# Patient Record
Sex: Female | Born: 1955
Health system: Southern US, Community
[De-identification: ages and names within clinical notes are randomized; demographics above are authoritative.]

## PROBLEM LIST (undated history)

## (undated) DIAGNOSIS — Z8709 Personal history of other diseases of the respiratory system: Secondary | ICD-10-CM

## (undated) DIAGNOSIS — Z87440 Personal history of urinary (tract) infections: Secondary | ICD-10-CM

## (undated) DIAGNOSIS — Z8669 Personal history of other diseases of the nervous system and sense organs: Secondary | ICD-10-CM

## (undated) DIAGNOSIS — Z8719 Personal history of other diseases of the digestive system: Secondary | ICD-10-CM

## (undated) DIAGNOSIS — Z8711 Personal history of peptic ulcer disease: Secondary | ICD-10-CM

## (undated) DIAGNOSIS — Z8659 Personal history of other mental and behavioral disorders: Secondary | ICD-10-CM

## (undated) DIAGNOSIS — Z8739 Personal history of other diseases of the musculoskeletal system and connective tissue: Secondary | ICD-10-CM

## (undated) DIAGNOSIS — Z8619 Personal history of other infectious and parasitic diseases: Secondary | ICD-10-CM

## (undated) HISTORY — DX: Personal history of other diseases of the musculoskeletal system and connective tissue: Z87.39

## (undated) HISTORY — DX: Personal history of other diseases of the respiratory system: Z87.09

## (undated) HISTORY — DX: Personal history of other diseases of the digestive system: Z87.19

## (undated) HISTORY — DX: Personal history of urinary (tract) infections: Z87.440

## (undated) HISTORY — DX: Personal history of other mental and behavioral disorders: Z86.59

## (undated) HISTORY — PX: TONSILLECTOMY AND ADENOIDECTOMY: SUR1326

## (undated) HISTORY — DX: Personal history of peptic ulcer disease: Z87.11

## (undated) HISTORY — DX: Personal history of other infectious and parasitic diseases: Z86.19

## (undated) HISTORY — DX: Personal history of other diseases of the nervous system and sense organs: Z86.69

---

## 1998-01-13 HISTORY — PX: PARTIAL HYSTERECTOMY: SHX80

## 2004-09-20 ENCOUNTER — Ambulatory Visit: Payer: Self-pay | Admitting: Family Medicine

## 2004-10-04 ENCOUNTER — Other Ambulatory Visit: Admission: RE | Admit: 2004-10-04 | Discharge: 2004-10-04 | Payer: Self-pay | Admitting: Family Medicine

## 2004-10-04 ENCOUNTER — Ambulatory Visit: Payer: Self-pay | Admitting: Family Medicine

## 2004-11-05 ENCOUNTER — Ambulatory Visit: Payer: Self-pay | Admitting: Family Medicine

## 2004-12-31 ENCOUNTER — Ambulatory Visit: Payer: Self-pay | Admitting: Family Medicine

## 2005-02-18 ENCOUNTER — Ambulatory Visit: Payer: Self-pay | Admitting: Family Medicine

## 2005-05-07 ENCOUNTER — Other Ambulatory Visit: Payer: Self-pay

## 2005-05-07 ENCOUNTER — Emergency Department: Payer: Self-pay | Admitting: Emergency Medicine

## 2006-08-27 ENCOUNTER — Ambulatory Visit: Payer: Self-pay | Admitting: Family Medicine

## 2006-09-17 ENCOUNTER — Ambulatory Visit: Payer: Self-pay | Admitting: Family Medicine

## 2007-08-16 ENCOUNTER — Emergency Department: Payer: Self-pay | Admitting: Emergency Medicine

## 2007-11-08 DIAGNOSIS — K279 Peptic ulcer, site unspecified, unspecified as acute or chronic, without hemorrhage or perforation: Secondary | ICD-10-CM | POA: Insufficient documentation

## 2007-11-08 DIAGNOSIS — K219 Gastro-esophageal reflux disease without esophagitis: Secondary | ICD-10-CM

## 2007-11-08 DIAGNOSIS — N6019 Diffuse cystic mastopathy of unspecified breast: Secondary | ICD-10-CM

## 2007-11-08 DIAGNOSIS — G4700 Insomnia, unspecified: Secondary | ICD-10-CM

## 2007-11-08 DIAGNOSIS — F411 Generalized anxiety disorder: Secondary | ICD-10-CM | POA: Insufficient documentation

## 2007-11-08 DIAGNOSIS — G43909 Migraine, unspecified, not intractable, without status migrainosus: Secondary | ICD-10-CM

## 2007-11-09 ENCOUNTER — Ambulatory Visit: Payer: Self-pay | Admitting: Family Medicine

## 2007-11-09 DIAGNOSIS — IMO0002 Reserved for concepts with insufficient information to code with codable children: Secondary | ICD-10-CM

## 2007-11-22 ENCOUNTER — Ambulatory Visit: Payer: Self-pay | Admitting: Internal Medicine

## 2007-11-22 LAB — CONVERTED CEMR LAB
Bilirubin Urine: NEGATIVE
Nitrite: NEGATIVE
Protein, U semiquant: 100
Urobilinogen, UA: 0.2

## 2008-01-20 ENCOUNTER — Encounter: Payer: Self-pay | Admitting: Family Medicine

## 2008-02-11 ENCOUNTER — Encounter: Payer: Self-pay | Admitting: Family Medicine

## 2008-02-28 ENCOUNTER — Ambulatory Visit: Payer: Self-pay | Admitting: Family Medicine

## 2008-02-28 DIAGNOSIS — R109 Unspecified abdominal pain: Secondary | ICD-10-CM | POA: Insufficient documentation

## 2008-02-28 LAB — CONVERTED CEMR LAB
Glucose, Urine, Semiquant: NEGATIVE
Ketones, urine, test strip: NEGATIVE
Urine crystals, microscopic: 0 /hpf
Urobilinogen, UA: 0.2
WBC Urine, dipstick: NEGATIVE
pH: 6

## 2008-02-29 ENCOUNTER — Encounter: Payer: Self-pay | Admitting: Family Medicine

## 2008-03-03 ENCOUNTER — Encounter: Payer: Self-pay | Admitting: Family Medicine

## 2008-03-03 ENCOUNTER — Ambulatory Visit: Payer: Self-pay | Admitting: Family Medicine

## 2008-04-19 ENCOUNTER — Ambulatory Visit: Payer: Self-pay | Admitting: Family Medicine

## 2008-04-19 DIAGNOSIS — K589 Irritable bowel syndrome without diarrhea: Secondary | ICD-10-CM

## 2008-04-21 LAB — CONVERTED CEMR LAB
ALT: 33 units/L (ref 0–35)
AST: 27 units/L (ref 0–37)
Basophils Absolute: 0 10*3/uL (ref 0.0–0.1)
Basophils Relative: 0.3 % (ref 0.0–3.0)
Eosinophils Absolute: 0.1 10*3/uL (ref 0.0–0.7)
Eosinophils Relative: 1.3 % (ref 0.0–5.0)
H Pylori IgG: NEGATIVE
HCT: 41.6 % (ref 36.0–46.0)
Hemoglobin: 14.4 g/dL (ref 12.0–15.0)
MCHC: 34.7 g/dL (ref 30.0–36.0)
MCV: 86.6 fL (ref 78.0–100.0)
Monocytes Absolute: 0.6 10*3/uL (ref 0.1–1.0)
Neutro Abs: 2.9 10*3/uL (ref 1.4–7.7)
RBC: 4.8 M/uL (ref 3.87–5.11)
Total Bilirubin: 0.7 mg/dL (ref 0.3–1.2)
Total Protein: 6.9 g/dL (ref 6.0–8.3)
WBC: 5.7 10*3/uL (ref 4.5–10.5)

## 2008-05-23 ENCOUNTER — Encounter: Payer: Self-pay | Admitting: Family Medicine

## 2008-05-30 DIAGNOSIS — K828 Other specified diseases of gallbladder: Secondary | ICD-10-CM

## 2008-08-29 ENCOUNTER — Ambulatory Visit: Payer: Self-pay | Admitting: Family Medicine

## 2008-08-29 DIAGNOSIS — R5383 Other fatigue: Secondary | ICD-10-CM

## 2008-08-29 DIAGNOSIS — R5381 Other malaise: Secondary | ICD-10-CM

## 2008-08-31 LAB — CONVERTED CEMR LAB
Albumin: 3.7 g/dL (ref 3.5–5.2)
CO2: 32 meq/L (ref 19–32)
Calcium: 9.1 mg/dL (ref 8.4–10.5)
Creatinine, Ser: 0.6 mg/dL (ref 0.4–1.2)
GFR calc Af Amer: 134 mL/min
GFR calc non Af Amer: 111 mL/min
Glucose, Bld: 82 mg/dL (ref 70–99)
Sodium: 143 meq/L (ref 135–145)

## 2008-09-19 ENCOUNTER — Encounter: Payer: Self-pay | Admitting: Family Medicine

## 2008-09-19 ENCOUNTER — Ambulatory Visit: Payer: Self-pay | Admitting: Family Medicine

## 2008-10-14 ENCOUNTER — Encounter: Payer: Self-pay | Admitting: Family Medicine

## 2008-12-06 ENCOUNTER — Ambulatory Visit: Payer: Self-pay | Admitting: Family Medicine

## 2008-12-07 ENCOUNTER — Telehealth (INDEPENDENT_AMBULATORY_CARE_PROVIDER_SITE_OTHER): Payer: Self-pay | Admitting: Internal Medicine

## 2009-01-12 ENCOUNTER — Telehealth: Payer: Self-pay | Admitting: Family Medicine

## 2009-01-26 ENCOUNTER — Encounter: Payer: Self-pay | Admitting: Family Medicine

## 2009-01-30 ENCOUNTER — Ambulatory Visit: Payer: Self-pay | Admitting: General Surgery

## 2009-05-11 ENCOUNTER — Ambulatory Visit: Payer: Self-pay | Admitting: Family Medicine

## 2009-05-11 DIAGNOSIS — M542 Cervicalgia: Secondary | ICD-10-CM

## 2009-06-01 ENCOUNTER — Telehealth: Payer: Self-pay | Admitting: Family Medicine

## 2010-01-07 ENCOUNTER — Encounter: Payer: Self-pay | Admitting: Family Medicine

## 2010-06-12 ENCOUNTER — Ambulatory Visit: Payer: Self-pay | Admitting: Family Medicine

## 2010-06-13 LAB — CONVERTED CEMR LAB
ALT: 35 units/L (ref 0–35)
AST: 27 units/L (ref 0–37)
Albumin: 3.8 g/dL (ref 3.5–5.2)
Alkaline Phosphatase: 86 units/L (ref 39–117)
Basophils Relative: 0.6 % (ref 0.0–3.0)
Calcium: 8.8 mg/dL (ref 8.4–10.5)
Eosinophils Relative: 1.2 % (ref 0.0–5.0)
GFR calc non Af Amer: 84.99 mL/min (ref >60)
HCT: 42.7 % (ref 36.0–46.0)
HDL: 40.5 mg/dL (ref >39.00)
Hemoglobin: 14.7 g/dL (ref 12.0–15.0)
Lymphocytes Relative: 38.3 % (ref 12.0–46.0)
Lymphs Abs: 2.8 10*3/uL (ref 0.7–4.0)
Monocytes Relative: 8.4 % (ref 3.0–12.0)
Neutro Abs: 3.8 10*3/uL (ref 1.4–7.7)
Potassium: 3.5 meq/L (ref 3.5–5.1)
RBC: 4.87 M/uL (ref 3.87–5.11)
Sodium: 142 meq/L (ref 135–145)
TSH: 1.99 microintl units/mL (ref 0.35–5.50)
Total CHOL/HDL Ratio: 4
Total Protein: 6.6 g/dL (ref 6.0–8.3)
Triglycerides: 261 mg/dL — ABNORMAL HIGH (ref 0.0–149.0)

## 2010-10-08 ENCOUNTER — Telehealth: Payer: Self-pay | Admitting: Family Medicine

## 2010-10-08 DIAGNOSIS — R109 Unspecified abdominal pain: Secondary | ICD-10-CM | POA: Insufficient documentation
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
## 2010-10-15 NOTE — Letter (Signed)
Summary: Patient Cancellation/[facility]  Patient Cancellation/[facility]   Imported By: Lanelle Bal 04/16/2010 10:31:21  _____________________________________________________________________  External Attachment:    Type:   Image     Comment:   External Document

## 2010-10-17 NOTE — Progress Notes (Signed)
Summary: ???hysterectomy  Phone Note Call from Patient Call back at Home Phone [phone] Call back at Work Phone [phone]   Caller: Patient Call For: Judith Part MD Summary of Call: Patient had a partial hysterectomy in 99. Patient says that for about a year now she has been having symptoms as if she were on her period about every 2 weeks. She has lower back pain, cramping, breast are sore. She is asking if this is normal and if not should she set up appt to see youi. Please advise.  Initial call taken by: Melody Comas,  October 08, 2010 1:53 PM  Follow-up for Phone Call        no - that is unusual as at her age she should have gone through menopause  I would recommend f/u with her gyn (looks like Dr Zenaida Niece dalen in past ) to address this  let me know if she wants a ref or to make her own appt  Follow-up by: Judith Part MD,  October 08, 2010 2:27 PM  Additional Follow-up for Phone Call Additional follow up Details #1::        Patient notified as instructed by telephone. Pt said she would like for Dr Milinda Antis to do the referral to Dr Haskel Khan. Pt would like appt if possible 1:00 -1:30pm any day. Pt will wait to hear from pt care coordinator. Pt can be reached during the day at work [phone] and if after 4:30pm please call pt cell [phone].Lewanda Rife LPN  October 08, 2010 2:51 PM   New Problems: PELVIC  PAIN (ICD-789.09)   Additional Follow-up for Phone Call Additional follow up Details #2::    I will do ref for [facility] Follow-up by: Judith Part MD,  October 08, 2010 8:54 PM  New Problems: PELVIC  PAIN (ICD-789.09)

## 2011-09-02 ENCOUNTER — Inpatient Hospital Stay: Payer: Self-pay | Admitting: Unknown Physician Specialty

## 2013-10-21 ENCOUNTER — Ambulatory Visit (INDEPENDENT_AMBULATORY_CARE_PROVIDER_SITE_OTHER): Payer: BC Managed Care – PPO | Admitting: Family Medicine

## 2013-10-21 ENCOUNTER — Encounter: Payer: Self-pay | Admitting: Family Medicine

## 2013-10-21 VITALS — BP 122/80 | HR 83 | Temp 97.8°F | Wt 178.0 lb

## 2013-10-21 DIAGNOSIS — Z1231 Encounter for screening mammogram for malignant neoplasm of breast: Secondary | ICD-10-CM

## 2013-10-21 DIAGNOSIS — IMO0001 Reserved for inherently not codable concepts without codable children: Secondary | ICD-10-CM

## 2013-10-21 DIAGNOSIS — R638 Other symptoms and signs concerning food and fluid intake: Secondary | ICD-10-CM | POA: Insufficient documentation

## 2013-10-21 DIAGNOSIS — M25579 Pain in unspecified ankle and joints of unspecified foot: Secondary | ICD-10-CM

## 2013-10-21 DIAGNOSIS — Z833 Family history of diabetes mellitus: Secondary | ICD-10-CM

## 2013-10-21 DIAGNOSIS — Z0000 Encounter for general adult medical examination without abnormal findings: Secondary | ICD-10-CM | POA: Insufficient documentation

## 2013-10-21 LAB — CBC WITH DIFFERENTIAL/PLATELET
BASOS PCT: 0.4 % (ref 0.0–3.0)
Basophils Absolute: 0 10*3/uL (ref 0.0–0.1)
EOS PCT: 0.9 % (ref 0.0–5.0)
Eosinophils Absolute: 0.1 10*3/uL (ref 0.0–0.7)
HCT: 44.1 % (ref 36.0–46.0)
Hemoglobin: 14.7 g/dL (ref 12.0–15.0)
LYMPHS PCT: 32.5 % (ref 12.0–46.0)
Lymphs Abs: 2.1 10*3/uL (ref 0.7–4.0)
MCHC: 33.3 g/dL (ref 30.0–36.0)
MCV: 88.3 fl (ref 78.0–100.0)
MONOS PCT: 6.7 % (ref 3.0–12.0)
Monocytes Absolute: 0.4 10*3/uL (ref 0.1–1.0)
NEUTROS PCT: 59.5 % (ref 43.0–77.0)
Neutro Abs: 3.9 10*3/uL (ref 1.4–7.7)
Platelets: 205 10*3/uL (ref 150.0–400.0)
RBC: 4.99 Mil/uL (ref 3.87–5.11)
RDW: 13.1 % (ref 11.5–14.6)
WBC: 6.6 10*3/uL (ref 4.5–10.5)

## 2013-10-21 LAB — POCT CBG (FASTING - GLUCOSE)-MANUAL ENTRY: Glucose Fasting, POC: 100 mg/dL — AB (ref 70–99)

## 2013-10-21 LAB — HEMOGLOBIN A1C: Hgb A1c MFr Bld: 5.6 % (ref 4.6–6.5)

## 2013-10-21 LAB — TSH: TSH: 1.83 u[IU]/mL (ref 0.35–5.50)

## 2013-10-21 NOTE — Progress Notes (Signed)
Subjective:    Patient ID: Sarah Case, female    DOB: 09/06/1956, 58 y.o.   MRN: [id number]  HPI Here to re establish   Has had some foot and leg pain and also hands  Also cramps  She takes some otc K - ? How much Also vit C 1000-3000 mg daily   Wondered about DM  Fasting sugar is 100   She has had more thirst and urination  This has been going on for a few years   Wt is fairly stable - needs to loose weight   Walks for exercise - not extra besides - daily activities  Does not really watch her diet - but not a big sweet eater - her downfall is bread and starches   Father had DM - died in his 1240s- IDDM   She will come back for a PE on Tuesday - will get labs today   Patient Active Problem List   Diagnosis Date Noted  . Thirst 10/21/2013  . Pain in joint, ankle and foot 10/21/2013  . Routine general medical examination at a health care facility 10/21/2013  . Other screening mammogram 10/21/2013  . PELVIC  PAIN 10/08/2010  . FOOT PAIN 06/12/2010  . THIRST 06/12/2010  . NECK PAIN 05/11/2009  . FATIGUE 08/29/2008  . OTHER SPECIFIED DISORDER OF GALLBLADDER 05/30/2008  . IRRITABLE BOWEL SYNDROME 04/19/2008  . ABDOMINAL PAIN, RECURRENT 02/28/2008  . DYSPAREUNIA 11/09/2007  . ANXIETY 11/08/2007  . GERD 11/08/2007  . PEPTIC ULCER DISEASE 11/08/2007  . FIBROCYSTIC BREAST DISEASE 11/08/2007  . INSOMNIA 11/08/2007  . MIGRAINES, HX OF 11/08/2007   No past medical history on file. No past surgical history on file. History  Substance Use Topics  . Smoking status: Never Smoker   . Smokeless tobacco: Not on file  . Alcohol Use: No   No family history on file. Not on File No current outpatient prescriptions on file prior to visit.   No current facility-administered medications on file prior to visit.    Review of Systems Review of Systems  Constitutional: Negative for fever, appetite change, fatigue and unexpected weight change.  Eyes: Negative for pain and  visual disturbance.  Respiratory: Negative for cough and shortness of breath.   Cardiovascular: Negative for cp or palpitations    Gastrointestinal: Negative for nausea, diarrhea and constipation.  Genitourinary: Negative for urgency and frequency. pos for excessive thirst for years  Skin: Negative for pallor or rash   Neurological: Negative for weakness, light-headedness, and headaches.  Hematological: Negative for adenopathy. Does not bruise/bleed easily.  Psychiatric/Behavioral: Negative for dysphoric mood. The patient is not nervous/anxious.         Objective:   Physical Exam  Nursing note and vitals reviewed. Constitutional: She appears well-developed and well-nourished. No distress.  obese and well appearing   HENT:  Head: Normocephalic and atraumatic.  Eyes: Conjunctivae and EOM are normal. Pupils are equal, round, and reactive to light.  Neck: Normal range of motion. Neck supple.  Cardiovascular: Normal rate, regular rhythm and intact distal pulses.   Musculoskeletal: She exhibits tenderness. She exhibits no edema.  Some tenderness in ankles and arches of feet  Lymphadenopathy:    She has no cervical adenopathy.  Neurological: She is alert. She has normal reflexes. No cranial nerve deficit. She exhibits normal muscle tone. Coordination normal.  Skin: Skin is warm and dry. No rash noted. No erythema. No pallor.  Psychiatric: She has a normal mood and affect.  Assessment & Plan:

## 2013-10-21 NOTE — Patient Instructions (Addendum)
Try to get 1200-1500 mg of calcium per day with at least 1000 iu of vitamin D - for bone health  We will refer you for mammogram at check out  Labs today I will see you on Tuesday  For leg cramps - try magnesium 250 mg daily over the counter

## 2013-10-21 NOTE — Progress Notes (Signed)
Pre-visit discussion using our clinic review tool. No additional management support is needed unless otherwise documented below in the visit note.  

## 2013-10-23 NOTE — Assessment & Plan Note (Signed)
Lab today for f/u next week CPX

## 2013-10-23 NOTE — Assessment & Plan Note (Signed)
Foot pain is likely msk - disc shoe choices No symptoms or neuropathy-nl exam

## 2013-10-23 NOTE — Assessment & Plan Note (Signed)
Results for orders placed in visit on 10/21/13  POCT CBG (FASTING - GLUCOSE)-MANUAL ENTRY      Result Value Range   Glucose Fasting, POC 100 (*) 70 - 99 mg/dL  [id number] with labs today Thirst has been longstanding Disc need for wt loss to prevent DM

## 2013-10-23 NOTE — Assessment & Plan Note (Signed)
Ref for mammogram annual

## 2013-10-24 LAB — COMPREHENSIVE METABOLIC PANEL
ALBUMIN: 4.2 g/dL (ref 3.5–5.2)
ALT: 52 U/L — ABNORMAL HIGH (ref 0–35)
AST: 44 U/L — ABNORMAL HIGH (ref 0–37)
Alkaline Phosphatase: 74 U/L (ref 39–117)
BUN: 12 mg/dL (ref 6–23)
CALCIUM: 9.1 mg/dL (ref 8.4–10.5)
CHLORIDE: 107 meq/L (ref 96–112)
CO2: 31 meq/L (ref 19–32)
Creatinine, Ser: 0.7 mg/dL (ref 0.4–1.2)
GFR: 97.34 mL/min (ref >60.00)
GLUCOSE: 95 mg/dL (ref 70–99)
POTASSIUM: 3.8 meq/L (ref 3.5–5.1)
Sodium: 143 mEq/L (ref 135–145)
Total Bilirubin: 0.6 mg/dL (ref 0.3–1.2)
Total Protein: 7.1 g/dL (ref 6.0–8.3)

## 2013-10-24 LAB — LIPID PANEL
CHOLESTEROL: 186 mg/dL (ref 0–200)
HDL: 45 mg/dL (ref >39.00)
LDL CALC: 114 mg/dL — AB (ref 0–99)
Total CHOL/HDL Ratio: 4
Triglycerides: 136 mg/dL (ref 0.0–149.0)
VLDL: 27.2 mg/dL (ref 0.0–40.0)

## 2013-10-25 ENCOUNTER — Ambulatory Visit (INDEPENDENT_AMBULATORY_CARE_PROVIDER_SITE_OTHER): Payer: BC Managed Care – PPO | Admitting: Family Medicine

## 2013-10-25 ENCOUNTER — Encounter: Payer: Self-pay | Admitting: Family Medicine

## 2013-10-25 VITALS — BP 110/64 | HR 80 | Temp 98.0°F | Ht 62.5 in | Wt 177.0 lb

## 2013-10-25 DIAGNOSIS — Z1211 Encounter for screening for malignant neoplasm of colon: Secondary | ICD-10-CM | POA: Insufficient documentation

## 2013-10-25 DIAGNOSIS — Z0000 Encounter for general adult medical examination without abnormal findings: Secondary | ICD-10-CM

## 2013-10-25 DIAGNOSIS — K824 Cholesterolosis of gallbladder: Secondary | ICD-10-CM

## 2013-10-25 DIAGNOSIS — R748 Abnormal levels of other serum enzymes: Secondary | ICD-10-CM

## 2013-10-25 DIAGNOSIS — R928 Other abnormal and inconclusive findings on diagnostic imaging of breast: Secondary | ICD-10-CM

## 2013-10-25 LAB — AMYLASE: Amylase: 42 U/L (ref 27–131)

## 2013-10-25 LAB — HEPATIC FUNCTION PANEL
ALK PHOS: 74 U/L (ref 39–117)
ALT: 51 U/L — ABNORMAL HIGH (ref 0–35)
AST: 36 U/L (ref 0–37)
Albumin: 4 g/dL (ref 3.5–5.2)
Bilirubin, Direct: 0.1 mg/dL (ref 0.0–0.3)
TOTAL PROTEIN: 6.9 g/dL (ref 6.0–8.3)
Total Bilirubin: 0.8 mg/dL (ref 0.3–1.2)

## 2013-10-25 LAB — LIPASE: LIPASE: 29 U/L (ref 11.0–59.0)

## 2013-10-25 LAB — FERRITIN: Ferritin: 89.6 ng/mL (ref 10.0–291.0)

## 2013-10-25 NOTE — Patient Instructions (Addendum)
Try to work in exercise - work up to 30 minutes 5 days per week  Stop up front for mammogram referral  Stop up front for colonoscopy referral  For cholesterol- cut the red meat down - Avoid red meat/ fried foods/ egg yolks/ fatty breakfast meats/ butter, cheese and high fat dairy/ and shellfish   More labs for liver function today   When labs return we may want to get an ultrasound of abdomen again

## 2013-10-25 NOTE — Progress Notes (Signed)
Pre-visit discussion using our clinic review tool. No additional management support is needed unless otherwise documented below in the visit note.  

## 2013-10-25 NOTE — Progress Notes (Signed)
Subjective:    Patient ID: Sarah Case, female    DOB: 11-14-1955, 57 y.o.   MRN: [id number]  HPI Pt is here to re-est as a new pt  Was gone since 2011- but then did have a brief visit to screen for DM Has not had other care   Here for health maintenance exam and to review chronic medical problems    Wt is stable overall since 2011  Self care  Eats a fairly healthy diet  Exercise - is fair -active lifestyle but extra exercise (does walk her dog)  Would not consider exercise program yet -does not like exercise    Mood - is overall pretty chipper/ no depression    Has had a partial hysterectomy- that was for bleeding / no cancer  No gyn issues pr problems   Mammogram-needs to re order a dx test  Her last mamm 2011 from [facility] - had abn and pt failed to follow up - she did not mention that to me  Self exam-no lumps (has sore breasts occasionally)  Colon cancer screening -? If she has had one in the past - years ago  Wants ref for this   Declines flu vaccine   In labs - DM screen done- sugar was 100 random and  Lab Results  Component Value Date   HGBA1C 5.6 10/21/2013    No explanation of thirst   Lab Results  Component Value Date   CHOL 186 10/21/2013   CHOL 179 06/12/2010   Lab Results  Component Value Date   HDL 45.00 10/21/2013   HDL 40.50 06/12/2010   Lab Results  Component Value Date   LDLCALC 114* 10/21/2013   Lab Results  Component Value Date   TRIG 136.0 10/21/2013   TRIG 261.0* 06/12/2010   Lab Results  Component Value Date   CHOLHDL 4 10/21/2013   CHOLHDL 4 06/12/2010   Lab Results  Component Value Date   LDLDIRECT 119.3 06/12/2010   overall pretty stable lipids  She does avoid fatty and fried foods  She does eat a lot of red meat    K level is ok = she takes some otc  Lab Results  Component Value Date   K 3.8 10/21/2013      Chemistry      Component Value Date/Time   NA 143 10/21/2013 0845   K 3.8 10/21/2013 0845   CL 107 10/21/2013 0845    CO2 31 10/21/2013 0845   BUN 12 10/21/2013 0845   CREATININE 0.7 10/21/2013 0845      Component Value Date/Time   CALCIUM 9.1 10/21/2013 0845   ALKPHOS 74 10/21/2013 0845   AST 44* 10/21/2013 0845   ALT 52* 10/21/2013 0845   BILITOT 0.6 10/21/2013 0845     2 liver function tests were elevated  No alcohol  No tylenol/ acetaminophen  occ RUQ pain -not regularly - never told she had gallstones / ? In the past - she does not remember ? If poss fatty liver   Lab Results  Component Value Date   WBC 6.6 10/21/2013   HGB 14.7 10/21/2013   HCT 44.1 10/21/2013   MCV 88.3 10/21/2013   PLT 205.0 10/21/2013    Lab Results  Component Value Date   TSH 1.83 10/21/2013     Patient Active Problem List   Diagnosis Date Noted  . Thirst 10/21/2013  . Pain in joint, ankle and foot 10/21/2013  . Routine general medical examination at  a health care facility 10/21/2013  . Other screening mammogram 10/21/2013  . PELVIC  PAIN 10/08/2010  . THIRST 06/12/2010  . OTHER SPECIFIED DISORDER OF GALLBLADDER 05/30/2008  . IRRITABLE BOWEL SYNDROME 04/19/2008  . GERD 11/08/2007  . PEPTIC ULCER DISEASE 11/08/2007  . FIBROCYSTIC BREAST DISEASE 11/08/2007  . INSOMNIA 11/08/2007  . MIGRAINES, HX OF 11/08/2007   Past Medical History  Diagnosis Date  . History of hay fever   . History of stomach ulcers   . History of asthma   . History of arthritis   . History of chicken pox   . History of depression   . History of migraine   . History of UTI   . History of venereal warts     on hands   Past Surgical History  Procedure Laterality Date  . Partial hysterectomy  01/1998   History  Substance Use Topics  . Smoking status: Never Smoker   . Smokeless tobacco: Not on file  . Alcohol Use: No   Family History  Problem Relation Age of Onset  . Arthritis Mother   . Other Mother     Blood clots  . Lung cancer Sister   . Mental illness Mother   . Mental illness Sister   . Diabetes Father    No Known Allergies Current  Outpatient Prescriptions on File Prior to Visit  Medication Sig Dispense Refill  . calcium carbonate 200 MG capsule Take 250 mg by mouth daily.      Marland Kitchen. POTASSIUM PO Take 1 tablet by mouth daily.       No current facility-administered medications on file prior to visit.      Review of Systems Review of Systems  Constitutional: Negative for fever, appetite change, fatigue and unexpected weight change.  Eyes: Negative for pain and visual disturbance.  Respiratory: Negative for cough and shortness of breath.   Cardiovascular: Negative for cp or palpitations    Gastrointestinal: Negative for nausea, diarrhea and constipation. neg for abd pain  Genitourinary: Negative for urgency and frequency.  Skin: Negative for pallor or rash   Neurological: Negative for weakness, light-headedness, numbness and headaches.  Hematological: Negative for adenopathy. Does not bruise/bleed easily.  Psychiatric/Behavioral: Negative for dysphoric mood. The patient is not nervous/anxious.         Objective:   Physical Exam  Constitutional: She appears well-developed and well-nourished. No distress.  obese and well appearing   HENT:  Head: Normocephalic and atraumatic.  Right Ear: External ear normal.  Left Ear: External ear normal.  Mouth/Throat: Oropharynx is clear and moist.  Eyes: Conjunctivae and EOM are normal. Pupils are equal, round, and reactive to light. No scleral icterus.  Neck: Normal range of motion. Neck supple. No JVD present. Carotid bruit is not present. No thyromegaly present.  Cardiovascular: Normal rate, regular rhythm, normal heart sounds and intact distal pulses.  Exam reveals no gallop.   Pulmonary/Chest: Effort normal and breath sounds normal. No respiratory distress. She has no wheezes. She exhibits no tenderness.  Abdominal: Soft. Bowel sounds are normal. She exhibits no distension, no abdominal bruit and no mass. There is no tenderness.  Genitourinary: No breast swelling,  tenderness, discharge or bleeding.  Breast exam: No mass, nodules, thickening, tenderness, bulging, retraction, inflamation, nipple discharge or skin changes noted.  No axillary or clavicular LA.    Musculoskeletal: Normal range of motion. She exhibits no edema and no tenderness.  Lymphadenopathy:    She has no cervical adenopathy.  Neurological: She is  alert. She has normal reflexes. No cranial nerve deficit. She exhibits normal muscle tone. Coordination normal.  Skin: Skin is warm and dry. No rash noted. No erythema. No pallor.  Psychiatric: She has a normal mood and affect.          Assessment & Plan:

## 2013-10-26 ENCOUNTER — Encounter: Payer: Self-pay | Admitting: *Deleted

## 2013-10-26 LAB — HEPATITIS PANEL, ACUTE
HCV Ab: NEGATIVE
Hep A IgM: NONREACTIVE
Hep B C IgM: NONREACTIVE
Hepatitis B Surface Ag: NEGATIVE

## 2013-10-26 NOTE — Assessment & Plan Note (Signed)
Past hx on imaging-then lost to f/u Pt has one elevated LFT  Lab Results  Component Value Date   ALT 51* 10/25/2013   AST 36 10/25/2013   ALKPHOS 74 10/25/2013   BILITOT 0.8 10/25/2013    No other symptoms  Will consider re eval at f/u

## 2013-10-26 NOTE — Assessment & Plan Note (Signed)
Ref for screening colonoscopy 

## 2013-10-26 NOTE — Assessment & Plan Note (Signed)
Lab Results  Component Value Date   ALT 51* 10/25/2013   AST 36 10/25/2013   ALKPHOS 74 10/25/2013   BILITOT 0.8 10/25/2013   Remote hx of gb polyp on US  Will check hepatitis prof and ferritin If all neg consider [place]

## 2013-10-26 NOTE — Assessment & Plan Note (Signed)
Ref for dx mammo  Overdue - pt was prev lost to f/u without insurance and could not afford to go back

## 2013-10-26 NOTE — Assessment & Plan Note (Signed)
Reviewed health habits including diet and exercise and skin cancer prevention Reviewed appropriate screening tests for age  Also reviewed health mt list, fam hx and immunization status , as well as social and family history   Will ref for mammogram and colonoscopy Labs reviewed  Pt was urged to exercise - states candidly that she will not likely do that

## 2013-11-08 ENCOUNTER — Telehealth: Payer: Self-pay | Admitting: Family Medicine

## 2013-11-08 DIAGNOSIS — R928 Other abnormal and inconclusive findings on diagnostic imaging of breast: Secondary | ICD-10-CM

## 2013-11-08 NOTE — Telephone Encounter (Signed)
R breast US order

## 2013-11-08 NOTE — Telephone Encounter (Signed)
Message copied by Judy PimpleWER, MARNE A on Tue Nov 08, 2013  3:32 PM ------      Message from: Carlton AdamKOLOVRAT, MARION      Created: Tue Nov 08, 2013 10:06 AM      Regarding: Right breast US order       Hello Dr Karie Schwalbe, Please put an aorder in for a Right Breast US on this patient. [facility] just called and they need this as well as the [facility]. Also I heard that they have changed the choices for a Breast US so please pick the one that says Right Breast US inc Axilla b/c that is the one you want! Thanks, Shirlee LimerickMarion ------

## 2013-11-09 ENCOUNTER — Ambulatory Visit: Payer: Self-pay | Admitting: Family Medicine

## 2013-11-25 ENCOUNTER — Encounter: Payer: Self-pay | Admitting: Family Medicine

## 2013-12-07 ENCOUNTER — Ambulatory Visit: Payer: Self-pay | Admitting: Gastroenterology

## 2013-12-19 ENCOUNTER — Encounter: Payer: Self-pay | Admitting: Internal Medicine

## 2013-12-19 ENCOUNTER — Ambulatory Visit (INDEPENDENT_AMBULATORY_CARE_PROVIDER_SITE_OTHER): Payer: BC Managed Care – PPO | Admitting: Internal Medicine

## 2013-12-19 VITALS — BP 110/78 | HR 87 | Temp 97.8°F | Wt 178.0 lb

## 2013-12-19 DIAGNOSIS — H1032 Unspecified acute conjunctivitis, left eye: Secondary | ICD-10-CM | POA: Insufficient documentation

## 2013-12-19 DIAGNOSIS — H1030 Unspecified acute conjunctivitis, unspecified eye: Secondary | ICD-10-CM

## 2013-12-19 MED ORDER — SULFACETAMIDE SODIUM 10 % OP SOLN: 2.0000 [drp] | Freq: Four times a day (QID) | OPHTHALMIC | Status: DC

## 2013-12-19 NOTE — Assessment & Plan Note (Signed)
May be allergic but could be bacterial Husband just treated for eye infection Will give sulfa drops Instructions about antihistamines

## 2013-12-19 NOTE — Patient Instructions (Signed)
Please try loratadine 10mg  1-2 daily, cetirizine 10mg  daily or fexofenadine 180 mg daily for your allergy symptoms.

## 2013-12-19 NOTE — Progress Notes (Signed)
Pre visit review using our clinic review tool, if applicable. No additional management support is needed unless otherwise documented below in the visit note. 

## 2013-12-19 NOTE — Progress Notes (Signed)
   Subjective:    Patient ID: Sarah RubinsMarilyn A Schroll, female    DOB: 08/12/1956, 58 y.o.   MRN: [id number]  HPI Here with husband  Husband had infection in eye---seen last week at [facility] urgent care Treated with oral antibiotics  She noticed redness and discharge in left eye today Has had some rhinorrhea yesterday--just clear No cough ?low grade fever--- feels like Doesn't feel sick  Current Outpatient Prescriptions on File Prior to Visit  Medication Sig Dispense Refill  . Ascorbic Acid (VITAMIN C PO) Take 1 tablet by mouth daily.      . calcium carbonate 200 MG capsule Take 250 mg by mouth daily.      Marland Kitchen. MAGNESIUM CITRATE PO Take 1 tablet by mouth daily.      Marland Kitchen. POTASSIUM PO Take 1 tablet by mouth daily.      . Vitamin D, Cholecalciferol, 1000 UNITS TABS Take 1 tablet by mouth daily.       No current facility-administered medications on file prior to visit.    No Known Allergies  Past Medical History  Diagnosis Date  . History of hay fever   . History of stomach ulcers   . History of asthma   . History of arthritis   . History of chicken pox   . History of depression   . History of migraine   . History of UTI   . History of venereal warts     on hands    Past Surgical History  Procedure Laterality Date  . Partial hysterectomy  01/1998    Family History  Problem Relation Age of Onset  . Arthritis Mother   . Other Mother     Blood clots  . Lung cancer Sister   . Mental illness Mother   . Mental illness Sister   . Diabetes Father     History   Social History  . Marital Status: Married    Spouse Name: N/A    Number of Children: N/A  . Years of Education: N/A   Occupational History  . Not on file.   Social History Main Topics  . Smoking status: Never Smoker   . Smokeless tobacco: Never Used  . Alcohol Use: No  . Drug Use: No  . Sexual Activity: Not on file   Other Topics Concern  . Not on file   Social History Narrative  . No narrative on file    Review of Systems Does have some early allergy symptoms---tree sensitive No sore throat     Objective:   Physical Exam  Constitutional: She appears well-developed and well-nourished. No distress.  HENT:  Mouth/Throat: Oropharynx is clear and moist. No oropharyngeal exudate.  Mild nasal congestion   Eyes:  Mild left conjunctival injection  Greenish discharge at inner canthus Right eye quiet           Assessment & Plan:

## 2013-12-27 ENCOUNTER — Encounter: Payer: Self-pay | Admitting: Family Medicine

## 2014-01-04 ENCOUNTER — Telehealth: Payer: Self-pay

## 2014-01-04 NOTE — Telephone Encounter (Signed)
Pt request nurse visit for tb skin test; offered pt appt but not convenient to pts schedule and pt will cb if needed.

## 2014-01-20 ENCOUNTER — Encounter: Payer: Self-pay | Admitting: Family Medicine

## 2014-06-26 ENCOUNTER — Telehealth: Payer: Self-pay | Admitting: Family Medicine

## 2014-06-26 NOTE — Telephone Encounter (Signed)
See prev. note. I can't tell by OV notes (feb 2015) what you recommended her to do for the foot/ankle pain, please advise

## 2014-06-26 NOTE — Telephone Encounter (Signed)
Pt can only come in on Tuesday Dr. Milinda Antisower is full tomorrow so pt scheduled appt for 07/04/14 (next Tuesday), pt declined to see another provider tomorrow. I advise pt if sxs worsen or she develops any new sxs to let us know asap. Pt verbalized understanding

## 2014-06-26 NOTE — Telephone Encounter (Signed)
Please have her come in for a visit - I am interested in poss xrays and some blood work to look for joint disease

## 2014-06-26 NOTE — Telephone Encounter (Signed)
Please ask what her symptoms are like now and what (if anything) is she taking

## 2014-06-26 NOTE — Telephone Encounter (Signed)
Pt said she is having pain in both toes feet ankles knees and legs, and also bad hand pain in both hands. Pt said she also feels very fatigue and "worne out" feeling. Pt has tried Ibuprofen with very little improvement, pt said it's the same pain as when you saw her last time for this

## 2014-06-26 NOTE — Telephone Encounter (Signed)
Pt called stating she is having pain in feet and ankles.  She stated she saw dr tower about this last year and she wanted her medical assistance to call her.  Ms Sarah Case stated she wanted to know what dr tower told her to do last.  I offered to make her an appointment.  She wanted to speak with medical assistance first

## 2014-07-04 ENCOUNTER — Encounter: Payer: Self-pay | Admitting: Family Medicine

## 2014-07-04 ENCOUNTER — Ambulatory Visit (INDEPENDENT_AMBULATORY_CARE_PROVIDER_SITE_OTHER): Payer: BC Managed Care – PPO | Admitting: Family Medicine

## 2014-07-04 VITALS — BP 118/82 | HR 81 | Temp 97.8°F | Ht 62.5 in | Wt 175.8 lb

## 2014-07-04 DIAGNOSIS — R5383 Other fatigue: Secondary | ICD-10-CM | POA: Insufficient documentation

## 2014-07-04 DIAGNOSIS — M2550 Pain in unspecified joint: Secondary | ICD-10-CM | POA: Insufficient documentation

## 2014-07-04 DIAGNOSIS — R5382 Chronic fatigue, unspecified: Secondary | ICD-10-CM

## 2014-07-04 DIAGNOSIS — F341 Dysthymic disorder: Secondary | ICD-10-CM

## 2014-07-04 LAB — CBC WITH DIFFERENTIAL/PLATELET
BASOS ABS: 0 10*3/uL (ref 0.0–0.1)
Basophils Relative: 0.7 % (ref 0.0–3.0)
Eosinophils Absolute: 0.1 10*3/uL (ref 0.0–0.7)
Eosinophils Relative: 1.3 % (ref 0.0–5.0)
HCT: 44.8 % (ref 36.0–46.0)
HEMOGLOBIN: 14.9 g/dL (ref 12.0–15.0)
LYMPHS PCT: 42.2 % (ref 12.0–46.0)
Lymphs Abs: 2.4 10*3/uL (ref 0.7–4.0)
MCHC: 33.4 g/dL (ref 30.0–36.0)
MCV: 87.4 fl (ref 78.0–100.0)
Monocytes Absolute: 0.5 10*3/uL (ref 0.1–1.0)
Monocytes Relative: 8.6 % (ref 3.0–12.0)
NEUTROS ABS: 2.7 10*3/uL (ref 1.4–7.7)
Neutrophils Relative %: 47.2 % (ref 43.0–77.0)
PLATELETS: 196 10*3/uL (ref 150.0–400.0)
RBC: 5.12 Mil/uL — ABNORMAL HIGH (ref 3.87–5.11)
RDW: 12.7 % (ref 11.5–15.5)
WBC: 5.7 10*3/uL (ref 4.0–10.5)

## 2014-07-04 LAB — SEDIMENTATION RATE: Sed Rate: 14 mm/hr (ref 0–22)

## 2014-07-04 LAB — COMPREHENSIVE METABOLIC PANEL
ALK PHOS: 85 U/L (ref 39–117)
ALT: 49 U/L — ABNORMAL HIGH (ref 0–35)
AST: 36 U/L (ref 0–37)
Albumin: 3.5 g/dL (ref 3.5–5.2)
BILIRUBIN TOTAL: 0.7 mg/dL (ref 0.2–1.2)
BUN: 15 mg/dL (ref 6–23)
CO2: 24 mEq/L (ref 19–32)
Calcium: 9.3 mg/dL (ref 8.4–10.5)
Chloride: 106 mEq/L (ref 96–112)
Creatinine, Ser: 0.7 mg/dL (ref 0.4–1.2)
GFR: 89.26 mL/min (ref >60.00)
Glucose, Bld: 106 mg/dL — ABNORMAL HIGH (ref 70–99)
Potassium: 4.2 mEq/L (ref 3.5–5.1)
SODIUM: 143 meq/L (ref 135–145)
TOTAL PROTEIN: 7.1 g/dL (ref 6.0–8.3)

## 2014-07-04 LAB — VITAMIN D 25 HYDROXY (VIT D DEFICIENCY, FRACTURES): VITD: 36.2 ng/mL (ref 30.00–100.00)

## 2014-07-04 LAB — TSH: TSH: 2.7 u[IU]/mL (ref 0.35–4.50)

## 2014-07-04 LAB — VITAMIN B12: Vitamin B-12: 579 pg/mL (ref 211–911)

## 2014-07-04 NOTE — Patient Instructions (Signed)
Labs today for joint pain and fatigue  I think you would benefit from counseling for depression / sad mood /stress- let me know if you want to pursue this  Try to eat a healthy diet and take care of yourself
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
## 2014-07-04 NOTE — Progress Notes (Signed)
Pre visit review using our clinic review tool, if applicable. No additional management support is needed unless otherwise documented below in the visit note. 

## 2014-07-04 NOTE — Assessment & Plan Note (Signed)
Fairly nl exam - but pt does have some tenderness of ankles/ MCP joints and also trigger points in back and neck  PMR and fibromyalgia as well as OA or autoimmune arthritis are in the diff Lab today and update
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
## 2014-07-05 LAB — RHEUMATOID FACTOR

## 2014-07-05 LAB — ANA: ANA: NEGATIVE

## 2014-07-11 ENCOUNTER — Encounter: Payer: Self-pay | Admitting: Family Medicine

## 2014-07-11 ENCOUNTER — Ambulatory Visit (INDEPENDENT_AMBULATORY_CARE_PROVIDER_SITE_OTHER): Payer: BC Managed Care – PPO | Admitting: Family Medicine

## 2014-07-11 ENCOUNTER — Ambulatory Visit (INDEPENDENT_AMBULATORY_CARE_PROVIDER_SITE_OTHER)
Admission: RE | Admit: 2014-07-11 | Discharge: 2014-07-11 | Disposition: A | Discharge status: Home / Self Care | Payer: BC Managed Care – PPO | Source: Ambulatory Visit | Attending: Family Medicine | Admitting: Family Medicine

## 2014-07-11 VITALS — BP 124/78 | HR 81 | Temp 98.0°F | Ht 62.5 in | Wt 175.2 lb

## 2014-07-11 DIAGNOSIS — M25572 Pain in left ankle and joints of left foot: Secondary | ICD-10-CM

## 2014-07-11 DIAGNOSIS — K824 Cholesterolosis of gallbladder: Secondary | ICD-10-CM

## 2014-07-11 DIAGNOSIS — M7918 Myalgia, other site: Secondary | ICD-10-CM | POA: Insufficient documentation

## 2014-07-11 DIAGNOSIS — M791 Myalgia: Secondary | ICD-10-CM

## 2014-07-11 DIAGNOSIS — M2550 Pain in unspecified joint: Secondary | ICD-10-CM

## 2014-07-11 MED ORDER — GABAPENTIN 100 MG PO CAPS: 100.0000 mg | ORAL_CAPSULE | Freq: Two times a day (BID) | ORAL | Status: DC
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
## 2014-07-11 NOTE — Assessment & Plan Note (Signed)
More bloating/nausea and RUQ pain now  Will order abd us to re check this and look for stones or other processes

## 2014-07-11 NOTE — Patient Instructions (Signed)
Foot xray today  Stop at check out to schedule abdominal ultrasound  Try gabapentin 100 mg twice daily- it may make you sleepy at first - it intolerable let me know  Follow up with me in approx 1 month
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
## 2014-07-11 NOTE — Progress Notes (Signed)
Pre visit review using our clinic review tool, if applicable. No additional management support is needed unless otherwise documented below in the visit note. 

## 2014-07-11 NOTE — Assessment & Plan Note (Signed)
Going on for a while xr foot today  No swelling  Tender over 1-3rd metatarsals

## 2014-07-13 ENCOUNTER — Ambulatory Visit (INDEPENDENT_AMBULATORY_CARE_PROVIDER_SITE_OTHER): Payer: BC Managed Care – PPO | Admitting: Family Medicine

## 2014-07-13 ENCOUNTER — Encounter: Payer: Self-pay | Admitting: Family Medicine

## 2014-07-13 VITALS — BP 110/80 | HR 94 | Temp 98.0°F | Ht 62.5 in | Wt 176.5 lb

## 2014-07-13 DIAGNOSIS — M7918 Myalgia, other site: Secondary | ICD-10-CM

## 2014-07-13 DIAGNOSIS — Q666 Other congenital valgus deformities of feet: Secondary | ICD-10-CM

## 2014-07-13 DIAGNOSIS — M7061 Trochanteric bursitis, right hip: Secondary | ICD-10-CM

## 2014-07-13 DIAGNOSIS — M76829 Posterior tibial tendinitis, unspecified leg: Secondary | ICD-10-CM

## 2014-07-13 DIAGNOSIS — M7062 Trochanteric bursitis, left hip: Secondary | ICD-10-CM

## 2014-07-13 DIAGNOSIS — M791 Myalgia: Secondary | ICD-10-CM

## 2014-07-13 NOTE — Progress Notes (Signed)
Pre visit review using our clinic review tool, if applicable. No additional management support is needed unless otherwise documented below in the visit note. 
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
## 2014-07-13 NOTE — Patient Instructions (Signed)
Posterior Tib and arch rehab Begin with easy walking, heel, toe and backwards * Try to pick an easy location like a hallway or a room in your house and do one of these each time that you go through this area.  Only do the next one if it does not hurt Calf raises on a step - Pidgeon Toes, with toes turned inward Try to do most days of the week If pain persists at 3 sets of 30 - add backpack with 5 lbs Increase by 5 lbs per week to max of 30 lbs  Towel "Scrunch Ups" Use a hand towel or a moderate size towel Foot flat down on the towel Use toes to "scrunch up the towel" straight up and down, and going to the right and left.  3 sets of 20 * Can be done watching TV, reading, or sitting and relaxing.    SHOES: Danskos, Merrells, Keens, Hermanlarks, Birkenstocks - good arch support, want minimal bendability. Finn Comfort also excellent, but even more expensive.  Tennis Shoes / Running Shoes: Many good companies and styles. The most important thing is to get a good fit and wear a shoe that feels good in the store. Walk around in the store. Run if that is something that you can do. Some of the running stores have a treadmill, also.  Brooks, New Balance, Andrena MewsSaucony are good, but the most important thing is to wear a shoe that fits your foot and feels good.  FLEET FEET, [place]

## 2014-07-14 ENCOUNTER — Telehealth: Payer: Self-pay | Admitting: Family Medicine

## 2014-07-14 NOTE — Telephone Encounter (Signed)
Pt called: still having a lot of pain from feet to hips. Pt would like you recommendation on what type of shoe should she purchase since she is having a lot of pain?

## 2014-07-15 NOTE — Telephone Encounter (Signed)
I saw her yesterday. We had a lengthy discussion about this very question. Hopefully, her problems will get somewhat better on the order of weeks to months. It is not realistic to think you will be better in a few days.  Please resend her AVS, which details all of those specific questions to her.

## 2014-07-18 NOTE — Telephone Encounter (Signed)
This is not a yes / no answer.   My AVS lists a number of brands that are quite good. She needs to go by fit and feel. It would be hard to go wrong with Wells Fargoew Balance, TowBrooks, or [place]aucony.

## 2014-07-18 NOTE — Telephone Encounter (Signed)
I think all she is wanting is your recommendation on what type of shoe she should purchase??

## 2014-07-19 NOTE — Telephone Encounter (Signed)
Yes, I looked at her x-rays. They are totally fine. No arthritis.  I don't know anything about the store in [place].   I want her to get comfortable shoes -- her feet need to guide what feels the best to her.   New Balance, Merrells, SipseyBrooks, or Happy ValleySaucony are reasonable, but personal preference needs to determine what she gets.   The 2 stores that I think have the most selection and best salesman are [facility] [employer] on [address] And Off and Running on [address], both in [place].

## 2014-07-19 NOTE — Telephone Encounter (Signed)
Spoke with Sarah Case.  She states she went to [employer] and tried on many brands but was not able to find any shoe that didn't rub against her left ankle.   She was recommended another store in [place] called [facility] [employer] and wants to know how Dr. Patsy Lageropland feels about that store.  She would also like to  know if he is wanting her to get support plus cushion as far as shoes goes.  She is also inquiring whether Dr. Patsy Lageropland has looked at the x-ray that Dr. Milinda Antisower got of her Left Foot.  Please Advise.

## 2014-07-19 NOTE — Telephone Encounter (Signed)
Left message for Sarah Case to return my call. 

## 2014-07-19 NOTE — Telephone Encounter (Signed)
Left message for Ms. Fraizer to return my call.

## 2014-07-20 NOTE — Telephone Encounter (Signed)
Ms. Sarah Case notified as instructed by Dr. Patsy Lageropland.

## 2014-07-24 ENCOUNTER — Telehealth: Payer: Self-pay

## 2014-07-24 MED ORDER — GABAPENTIN 300 MG PO CAPS: 300.0000 mg | ORAL_CAPSULE | Freq: Three times a day (TID) | ORAL | Status: DC

## 2014-07-24 NOTE — Telephone Encounter (Signed)
I think she is taking 200 mg bid  I would inc that to 300 mg bid and after a week to tid (am/ midday and bedtime) as tolerated  Please call that in   Let me know how it goes in the next several weeks

## 2014-07-24 NOTE — Telephone Encounter (Signed)
Pt left v/m; pt doubled gabapentin and pt continues with pain without effectiveness. Pt wants to know if gabapentin can be increased more; pt is in pain all the time. Pt request cb.

## 2014-07-24 NOTE — Telephone Encounter (Signed)
Left voicemail letting pt know Dr. Royden Purlower's comments/recommendations  Rx sent to pharmacy

## 2014-07-25 ENCOUNTER — Ambulatory Visit: Payer: Self-pay | Admitting: Family Medicine

## 2014-07-25 ENCOUNTER — Encounter: Payer: Self-pay | Admitting: Family Medicine

## 2014-08-22 ENCOUNTER — Ambulatory Visit (INDEPENDENT_AMBULATORY_CARE_PROVIDER_SITE_OTHER): Payer: BC Managed Care – PPO | Admitting: Family Medicine

## 2014-08-22 ENCOUNTER — Encounter: Payer: Self-pay | Admitting: Family Medicine

## 2014-08-22 VITALS — BP 122/86 | HR 83 | Temp 98.1°F | Ht 62.5 in | Wt 176.8 lb

## 2014-08-22 DIAGNOSIS — M7918 Myalgia, other site: Secondary | ICD-10-CM

## 2014-08-22 DIAGNOSIS — R3911 Hesitancy of micturition: Secondary | ICD-10-CM

## 2014-08-22 DIAGNOSIS — F4323 Adjustment disorder with mixed anxiety and depressed mood: Secondary | ICD-10-CM

## 2014-08-22 DIAGNOSIS — M791 Myalgia: Secondary | ICD-10-CM

## 2014-08-22 DIAGNOSIS — R312 Other microscopic hematuria: Secondary | ICD-10-CM

## 2014-08-22 DIAGNOSIS — R3129 Other microscopic hematuria: Secondary | ICD-10-CM

## 2014-08-22 DIAGNOSIS — K824 Cholesterolosis of gallbladder: Secondary | ICD-10-CM

## 2014-08-22 DIAGNOSIS — R339 Retention of urine, unspecified: Secondary | ICD-10-CM

## 2014-08-22 LAB — POCT URINALYSIS DIPSTICK
Bilirubin, UA: NEGATIVE
Glucose, UA: NEGATIVE
LEUKOCYTES UA: NEGATIVE
NITRITE UA: NEGATIVE
PH UA: 6
PROTEIN UA: NEGATIVE
Spec Grav, UA: 1.025
Urobilinogen, UA: 0.2

## 2014-08-22 MED ORDER — GABAPENTIN 300 MG PO CAPS: 300.0000 mg | ORAL_CAPSULE | Freq: Three times a day (TID) | ORAL | Status: DC

## 2014-08-22 NOTE — Patient Instructions (Addendum)
Stop up front for referral to surgeon  Drink lots of water = I am sending your urine for a culture  If urine symptoms worsen please let me know Continue the gabapentin -I'm glad it is helping  Follow up with me in about 3 months Work on healthy diet and exercise for weight loss
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
## 2014-08-22 NOTE — Progress Notes (Signed)
Pre visit review using our clinic review tool, if applicable. No additional management support is needed unless otherwise documented below in the visit note. 

## 2014-08-23 DIAGNOSIS — F4323 Adjustment disorder with mixed anxiety and depressed mood: Secondary | ICD-10-CM | POA: Insufficient documentation

## 2014-08-23 LAB — URINE CULTURE
Colony Count: NO GROWTH
Organism ID, Bacteria: NO GROWTH
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
## 2014-08-23 NOTE — Assessment & Plan Note (Signed)
Unsure of cause  Did not start at same time as gabapentin  No gyn c/o  Urine sent for cx

## 2014-08-23 NOTE — Assessment & Plan Note (Signed)
ua had some micro hematuria  Sent for cx  Pt denies vaginal bleeding

## 2014-08-23 NOTE — Assessment & Plan Note (Signed)
Pt is symptomatic with RUQ pain intermittently  Rev her US - has stable gb polyps and fatty liver  She is open to surgical referral after the holidays  Ref done  If worse in the meantime will alert us

## 2014-08-23 NOTE — Assessment & Plan Note (Signed)
Improved on gabapentin  Plans to continue this and tolerating well  Some fatigue-was there before Disc benefits of low impact exercise

## 2014-08-30 ENCOUNTER — Telehealth: Payer: Self-pay

## 2014-08-30 NOTE — Telephone Encounter (Signed)
Please make appt for pt's cough, congestion, ears hurt, and sore throat with 1 avail provider

## 2014-08-30 NOTE — Telephone Encounter (Signed)
Please make appt for first avail

## 2014-08-30 NOTE — Telephone Encounter (Signed)
I spoke to patient and she's being seen at [facility].

## 2014-08-30 NOTE — Telephone Encounter (Signed)
PLEASE NOTE: All timestamps contained within this report are represented as [place] Standard Time. CONFIDENTIALTY NOTICE: This fax transmission is intended only for the addressee. It contains information that is legally privileged, confidential or otherwise protected from use or disclosure. If you are not the intended recipient, you are strictly prohibited from reviewing, disclosing, copying using or disseminating any of this information or taking any action in reliance on or regarding this information. If you have received this fax in error, please notify us immediately by telephone so that we can arrange for its return to us. Phone: [phone], Toll-Free: [phone], Fax: [phone] Page: 1 of 2 Call Id: [id number] [facility] - Client TELEPHONE ADVICE RECORD [facility] Patient Name: Sarah GrimeMARILYN Case Gender: Female DOB: 05/19/1956 Age: [age] Y 1 M 14 D Return Phone Number: [phone] (Primary) Address: City/State/Zip: [place] [facility] [place] [facility] Day Physician [place], [place] Contact Type Call Call Type Triage / Clinical Relationship To Patient Self Return Phone Number [phone] (Primary) Chief Complaint Cough Initial Comment Caller states has cough, congestion, ears hurt, sore throat PreDisposition Did not know what to do Nurse Assessment Nurse: Chrys RacerKoenig, RN, Alexia FreestoneAnna Marie Date/Time Lamount Cohen(Eastern Time): 08/30/2014 11:54:24 AM Confirm and document reason for call. If symptomatic, describe symptoms. ---Caller states has cough, congestion, ears hurt, sore throat Afebrile. No N/V/D Has the patient traveled out of the country within the last 30 days? ---No Does the patient require triage? ---Yes Related visit to physician within the last 2 weeks? ---Yes Does the PT have any chronic conditions? (i.e. diabetes, asthma, etc.) ---No Guidelines Guideline Title Affirmed  Question Affirmed Notes Nurse Date/Time Lamount Cohen(Eastern Time) Cough - Acute Non- Productive Earache is present Chrys RacerKoenig, RN, Alexia Freestonenna Marie 08/30/2014 11:56:15 AM Disp. Time Lamount Cohen(Eastern Time) Disposition Final User 08/30/2014 11:14:53 AM Attempt made - message left Chrys RacerKoenig, RN, Alexia Freestonenna Marie 08/30/2014 11:27:36 AM Attempt made - message left Chrys RacerKoenig, RN, Alexia Freestonenna Marie 08/30/2014 12:00:55 PM See Physician within 24 Hours Yes Chrys RacerKoenig, RN, Alexia FreestoneAnna Marie Caller Understands: Yes Disagree/Comply: Comply Care Advice Given Per Guideline PLEASE NOTE: All timestamps contained within this report are represented as [place] Standard Time. CONFIDENTIALTY NOTICE: This fax transmission is intended only for the addressee. It contains information that is legally privileged, confidential or otherwise protected from use or disclosure. If you are not the intended recipient, you are strictly prohibited from reviewing, disclosing, copying using or disseminating any of this information or taking any action in reliance on or regarding this information. If you have received this fax in error, please notify us immediately by telephone so that we can arrange for its return to us. Phone: [phone], Toll-Free: [phone], Fax: [phone] Page: 2 of 2 Call Id: [id number] Care Advice Given Per Guideline SEE PHYSICIAN WITHIN 24 HOURS: * IF OFFICE WILL BE OPEN: You need to be examined within the next 24 hours. Call your doctor when the office opens, and make an appointment. COUGH DROPS FOR COUGH: * Cough drops can help a lot, especially for mild coughs. They reduce coughing by soothing your irritated throat and removing that tickle sensation in the back of the throat. * Cough drops are available over-the-counter (OTC). HOME REMEDY - HARD CANDY: Hard candy works just as well as a medicine-flavored OTC cough drops. COUGHING SPELLS: * Drink warm fluids. Inhale warm mist. (Reason: both relax the airway and loosen up the phlegm) * Suck on cough drops  or hard candy to  coat the irritated throat. AVOID TOBACCO SMOKE: Smoking or being exposed to smoke makes coughs much worse. PAIN MEDICINES: * For pain relief, take acetaminophen, ibuprofen, or naproxen. ACETAMINOPHEN (E.G., TYLENOL): IBUPROFEN (E.G., MOTRIN, ADVIL): * Take 650 mg (two 325 mg pills) by mouth every 4-6 hours as needed. Each Regular Strength Tylenol pill has 325 mg of acetaminophen. The most you should take each day is 3,250 mg (10 Regular Strength pills a day). * Take 400 mg (two 200 mg pills) by mouth every 6 hours as needed. CAUTION - NSAIDS (E.G., IBUPROFEN, NAPROXEN): * GASTROINTESTINAL RISK: There is an increased risk of stomach ulcers, GI bleeding, perforation. CALL BACK IF: * You become worse. CARE ADVICE given per Cough - Acute Non- Productive (Adult) guideline. After Care Instructions Given Call Event Type User Date / Time Description Comments User: Kandace Blitzebra, Spencer Date/Time Lamount Cohen(Eastern Time): 08/30/2014 11:47:19 AM Patient has called back in and I have sparked the nurse. No response so I told pt nurse would try her again. Referrals REFERRED TO PCP OFFICE

## 2014-11-28 ENCOUNTER — Ambulatory Visit: Payer: BC Managed Care – PPO | Admitting: Family Medicine

## 2015-02-09 IMAGING — US ABDOMEN ULTRASOUND
1 series · 14 of 25 positions shown · non-contrast
Comparison: 01/30/2009

CLINICAL DATA: Right-sided abdominal pain

EXAM:
ULTRASOUND ABDOMEN COMPLETE

[Series 1: abdomen ultrasound · 0.23mm/px · 14 of 122 slices shown]
[im 1/122]
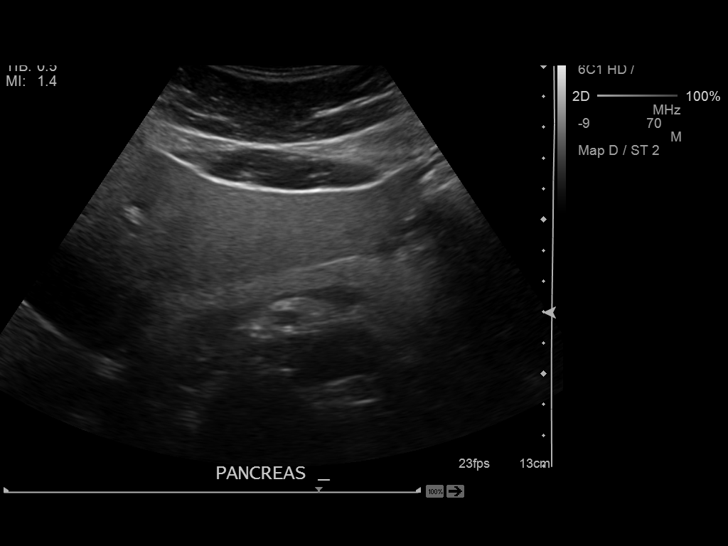
[im 11/122]
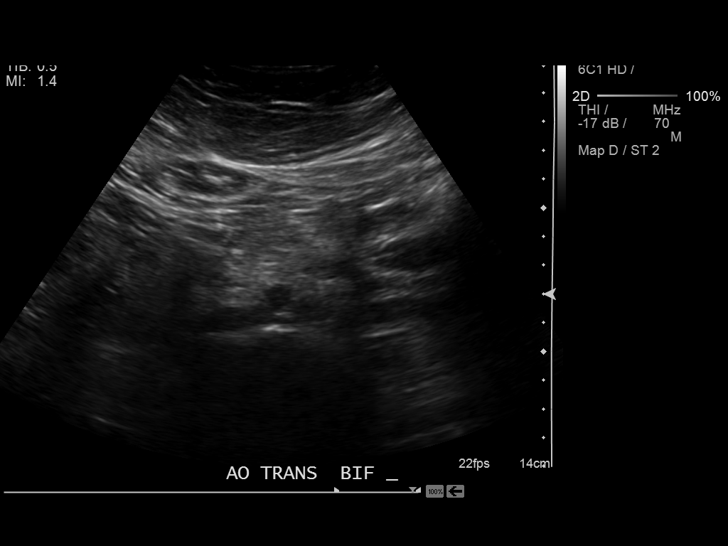
[im 21/122]
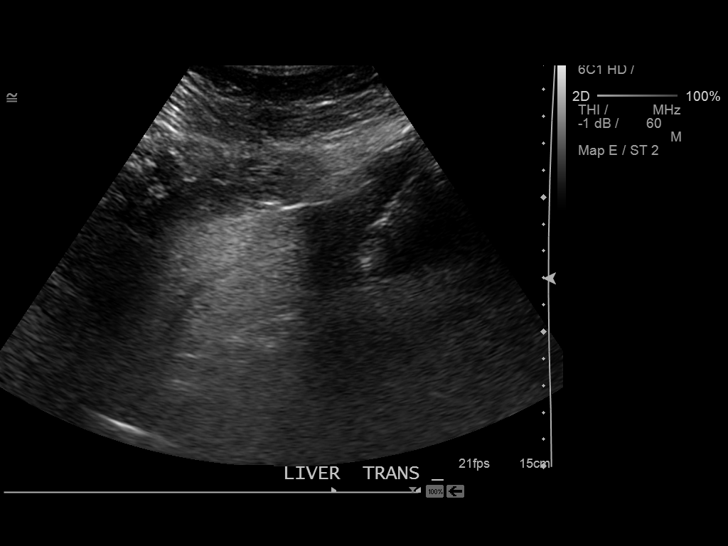
[im 31/122]
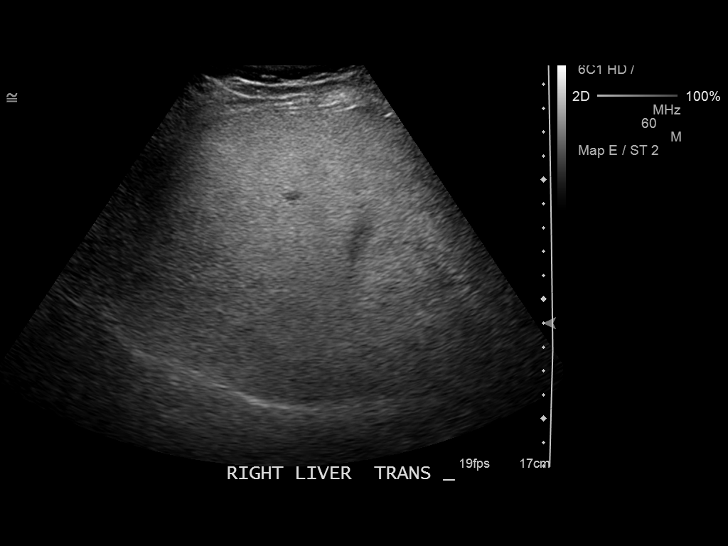
[im 41/122]
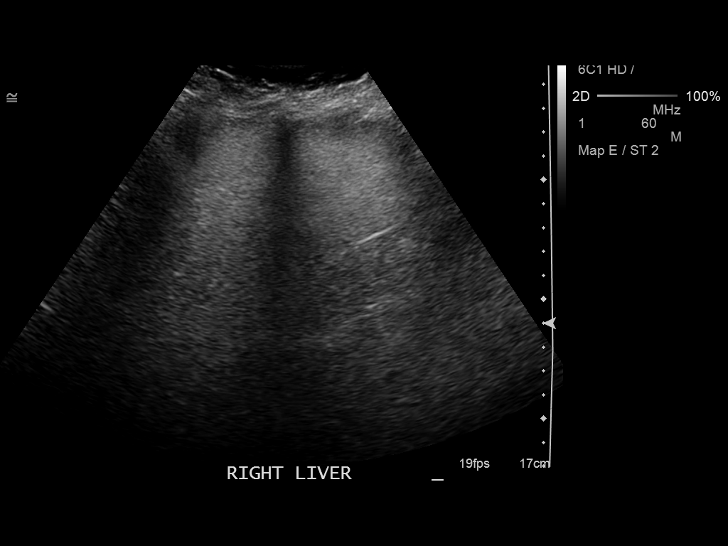
[im 46/122]
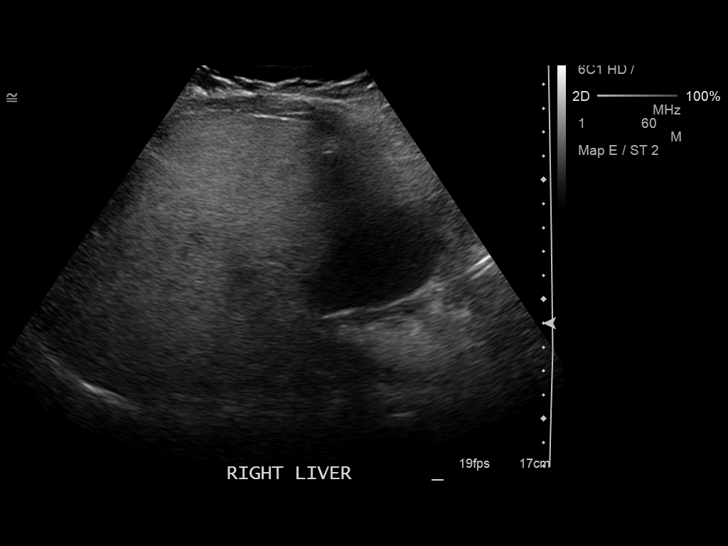
[im 56/122]
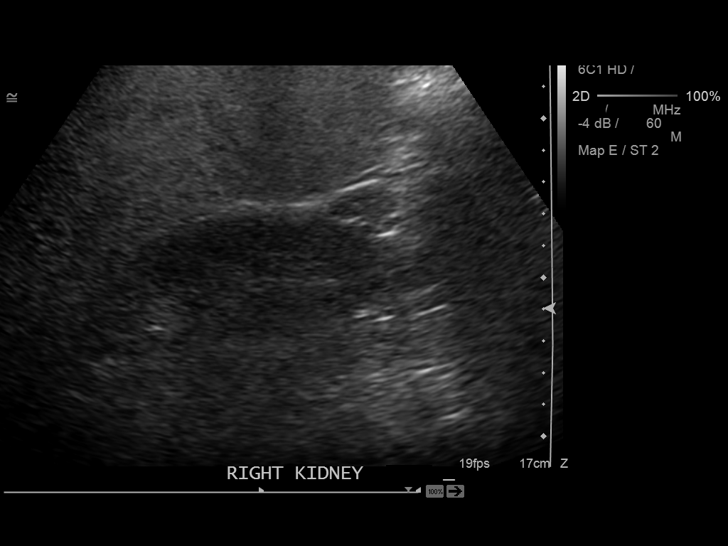
[im 66/122]
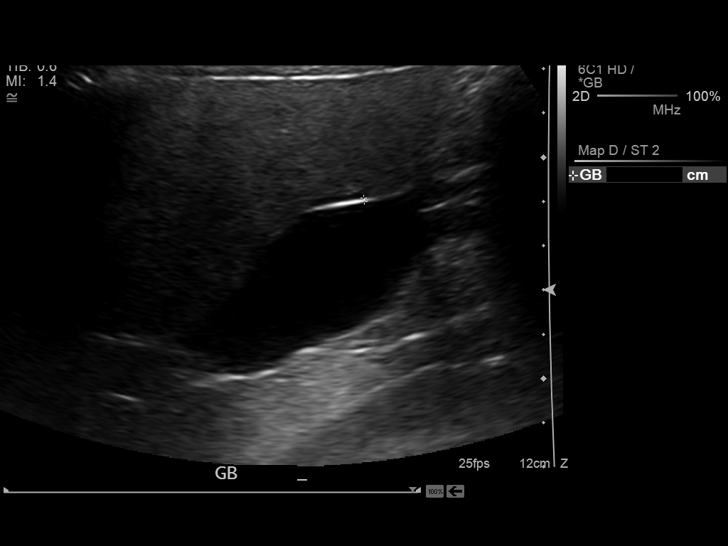
[im 76/122]
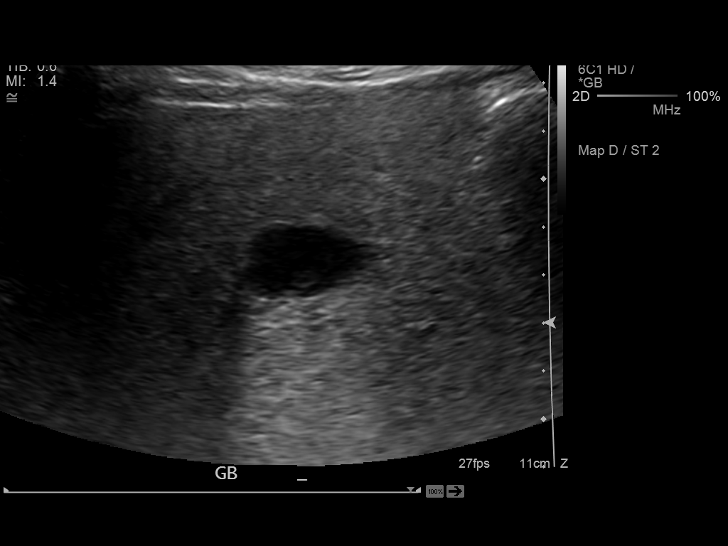
[im 81/122]
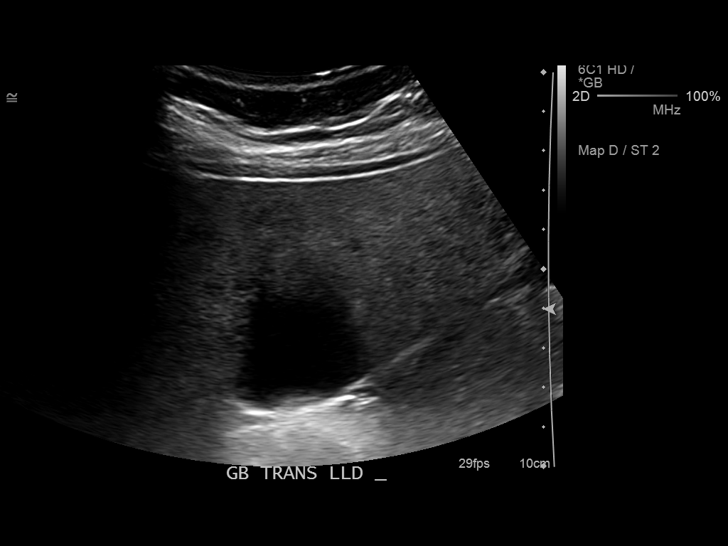
[im 91/122]
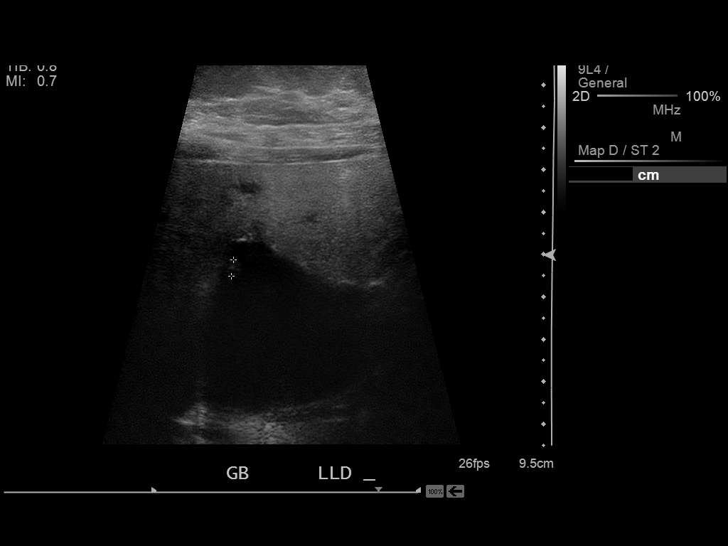
[im 101/122]
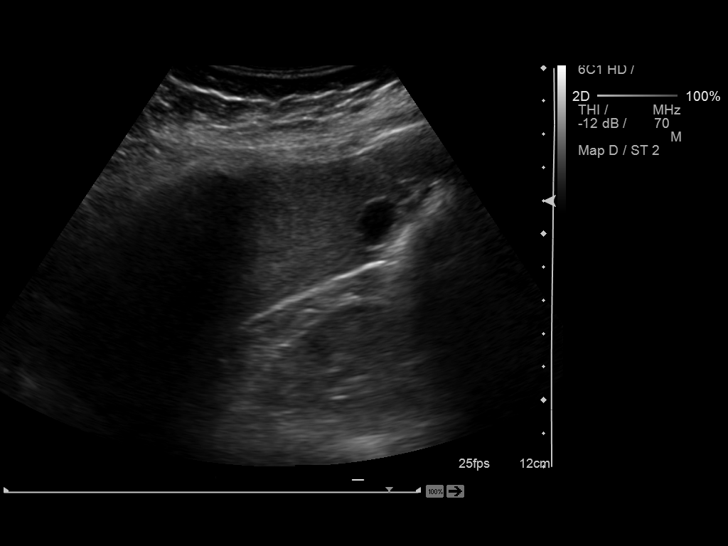
[im 111/122]
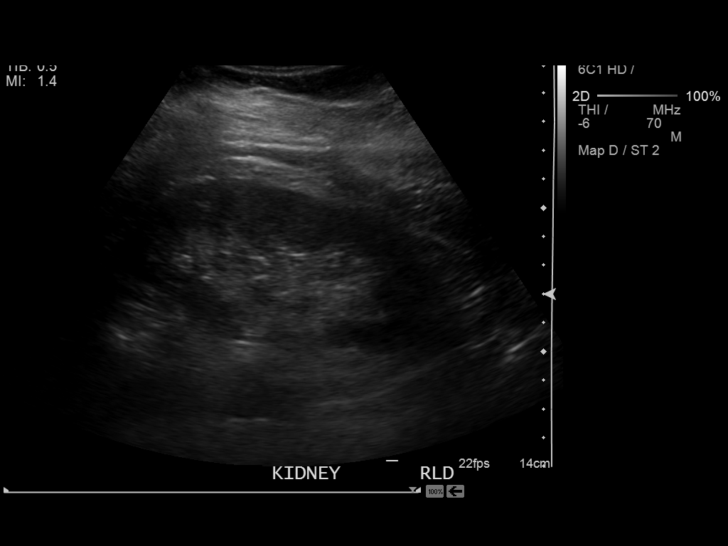
[im 122/122]
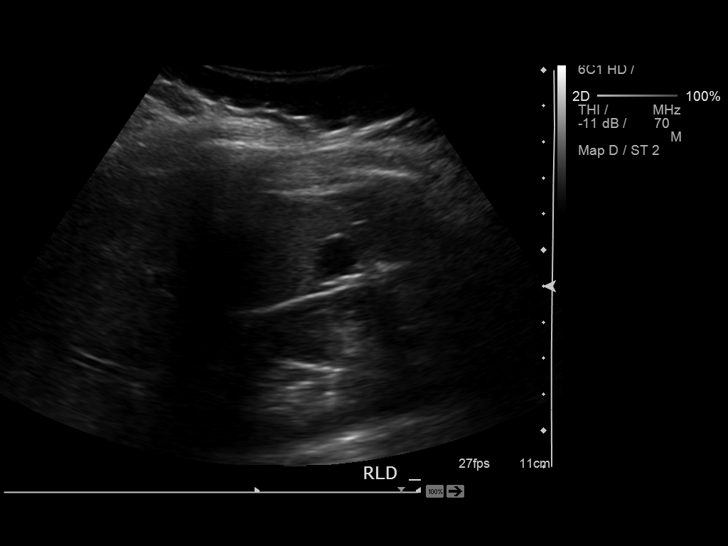

[14 of 25 positions shown; findings below may reference images not displayed]

FINDINGS: Gallbladder: Gallbladder polyps are identified. No cholelithiasis or
wall thickening is seen.

Common bile duct: Diameter: 3.7 mm.

Liver: Increased echogenicity consistent with fatty infiltration.

IVC: No abnormality visualized.

Pancreas: Visualized portion unremarkable.

Spleen: A 1.6 cm splenic cyst is noted. This is decreased
significantly in size when compared with the prior exam.

Right Kidney: Length: 10.9 cm. Echogenicity within normal limits. No
mass or hydronephrosis visualized.

Left Kidney: Length: 11.3 cm.. Echogenicity within normal limits. No
mass or hydronephrosis visualized.

Abdominal aorta: No aneurysm visualized.

Other findings: None.
IMPRESSION: Stable gallbladder polyps.

Fatty infiltration of the liver

Interval decrease in the size of splenic cyst.

No acute abnormality is noted.

## 2015-06-01 ENCOUNTER — Telehealth: Payer: Self-pay

## 2015-06-01 NOTE — Telephone Encounter (Signed)
Left voicemail letting pt know to f/u is sxs don't improve and go to ER/UC over weekend if sxs worsen

## 2015-06-01 NOTE — Telephone Encounter (Signed)
Follow up if symptoms do not improve

## 2015-06-01 NOTE — Telephone Encounter (Signed)
PLEASE NOTE: All timestamps contained within this report are represented as [place] Standard Time. CONFIDENTIALTY NOTICE: This fax transmission is intended only for the addressee. It contains information that is legally privileged, confidential or otherwise protected from use or disclosure. If you are not the intended recipient, you are strictly prohibited from reviewing, disclosing, copying using or disseminating any of this information or taking any action in reliance on or regarding this information. If you have received this fax in error, please notify us immediately by telephone so that we can arrange for its return to [place]. Phone: [phone], Toll-Free: [phone], Fax: [phone] Page: 1 of 2 Call Id: [id number] [facility] Night - Client TELEPHONE ADVICE RECORD [facility] Call Center Patient Name: Sarah Case Gender: Female DOB: 08/18/1956 Age: 59 Y 10 M 13 D Return Phone Number: [phone] (Primary) Address: City/State/Zip: [place] Client [facility] Night - Client Client Site [facility] - Night Physician [place], [place] Contact Type Call Call Type Triage / Clinical Relationship To Patient Self Return Phone Number [phone] (Primary) Chief Complaint Headache Initial Comment Caller states she has a UTI and a headache with nausea. PreDisposition Home Care Nurse Assessment Nurse: Orvis Brill, RN, Olegario Messier Date/Time Lamount Cohen Time): 05/31/2015 5:22:08 PM Confirm and document reason for call. If symptomatic, describe symptoms. ---Caller states that she had onset of low back pain this AM & thinks that she may have a UTI but denies any pain or burning with urination. Caller states that she has been advised previously that when she thought that she had a UTI that she could just call & antibiotics could be called in, caller advised that usually she needs to be examined & her urine tested for UTI. Caller  states that she wants to wait a few days to see if UTI symptoms resolve but had onset of HA this AM along with nausea but no vomiting. Has H/O migraines. Does want her HA symptom addressed. Has the patient traveled out of the country within the last 30 days? ---No Does the patient require triage? ---Yes Related visit to physician within the last 2 weeks? ---No Does the PT have any chronic conditions? (i.e. diabetes, asthma, etc.) ---Yes List chronic conditions. ---migraines, fibromyalgia Guidelines Guideline Title Affirmed Question Affirmed Notes Nurse Date/Time (Eastern Time) Headache [1] SEVERE headache (e.g., excruciating) AND [2] not improved after 2 hours of pain medicine 8 Orvis Brill, Levon Hedger 05/31/2015 5:25:37 PM Disp. Time Lamount Cohen Time) Disposition Final User 05/31/2015 5:30:52 PM See Physician within 4 Hours (or PCP triage) Yes Orvis Brill, RN, Olegario Messier PLEASE NOTE: All timestamps contained within this report are represented as [place] Standard Time. CONFIDENTIALTY NOTICE: This fax transmission is intended only for the addressee. It contains information that is legally privileged, confidential or otherwise protected from use or disclosure. If you are not the intended recipient, you are strictly prohibited from reviewing, disclosing, copying using or disseminating any of this information or taking any action in reliance on or regarding this information. If you have received this fax in error, please notify us immediately by telephone so that we can arrange for its return to [place]. Phone: [phone], Toll-Free: [phone], Fax: [phone] Page: 2 of 2 Call Id: [id number] Caller Understands: Yes Disagree/Comply: Disagree Disagree/Comply Reason: Disagree with instructions Care Advice Given Per Guideline * IF OFFICE WILL BE CLOSED AND NO PCP TRIAGE: You need to be seen within the next 3 or 4 hours. A nearby Urgent Care Center is often a good source of care.  Another choice is to go  to the ER. Go sooner if you become worse. PAIN MEDICINES: * For pain relief, take acetaminophen, ibuprofen, or naproxen. * Use the lowest amount that makes your pain feel better. IBUPROFEN (E.G., MOTRIN, ADVIL): * Take 400 mg (two 200 mg pills) by mouth every 6 hours as needed. * Another choice is to take 600 mg (three 200 mg pills) by mouth every 8 hours as needed. * The most you should take each day is 1,200 mg (six 200 mg pills a day), unless your doctor has told you to take more. EXTRA NOTES: * Before taking any medicine, read all the instructions on the package. CAUTION - NSAIDS (E.G., IBUPROFEN, NAPROXEN): * Do not take nonsteroidal anti-inflammatory drugs (NSAIDs) if you have stomach problems, kidney disease, heart failure, or other contraindications to using this type of medication. * Do not take NSAID medications for over 7 days without consulting your PCP. * You may take this medicine with or without food. Taking it with food or milk may lessen the chance the drug will upset your stomach. * GASTROINTESTINAL RISK: There is an increased risk of stomach ulcers, GI bleeding, perforation. * CARDIOVASCULAR RISK: There may be an increased risk of heart attack and stroke. DRIVING: Another adult should drive. CALL BACK IF: * You become worse. CARE ADVICE given per Headache (Adult) guideline. After Care Instructions Given Call Event Type User Date / Time Description Comments User: Almira Bar, RN Date/Time (Eastern Time): 05/31/2015 5:32:34 PM Caller advised to call back if she decides that she would like her symptom of low back pain & possible UTI addressed. Caller states that she doesn't think that she is going to go in to be evaluated within 3-4 hrs as recommended & is just going to see if will resolve at home as she thinks is a migraine. Referrals GO TO FACILITY REFUSED

## 2015-09-25 ENCOUNTER — Telehealth: Payer: Self-pay

## 2015-09-25 MED ORDER — ALPRAZOLAM 0.5 MG PO TABS: 0.5000 mg | ORAL_TABLET | Freq: Two times a day (BID) | ORAL | Status: DC | PRN

## 2015-09-25 NOTE — Telephone Encounter (Signed)
Pt left v/m requesting med for stress; pt having panic anxiety attacks and lt sided H/A; pt request med to [facility] [address]. Pt has already made 30 min appt to see Dr Milinda Antisower 10/09/15. Pt last seen 08/22/14. Unable to reach pt by phone.

## 2015-09-25 NOTE — Telephone Encounter (Signed)
I will give her xanax for the short term (please call it in)- we can make a better plan at her visit  This is habit forming and also sedating-do not work or drive with it  Follow up as planned

## 2015-09-25 NOTE — Telephone Encounter (Signed)
Left voicemail requesting pt to call office back 

## 2015-09-25 NOTE — Telephone Encounter (Signed)
Pt called back and has been feeling like this for 1 year; pt will wait until seen 10/09/15; advised if condition changes or worsens prior to appt pt will [facility]. FYI to Dr Milinda Antisower.

## 2015-09-25 NOTE — Telephone Encounter (Signed)
Pt notified of Dr. Royden Purlower's comments/ instructions and verbalized understanding, Rx called in as prescribed

## 2015-10-09 ENCOUNTER — Encounter: Payer: Self-pay | Admitting: Family Medicine

## 2015-10-09 ENCOUNTER — Ambulatory Visit (INDEPENDENT_AMBULATORY_CARE_PROVIDER_SITE_OTHER): Payer: BLUE CROSS/BLUE SHIELD | Admitting: Family Medicine

## 2015-10-09 VITALS — BP 132/78 | HR 105 | Temp 98.0°F | Ht 62.5 in | Wt 168.5 lb

## 2015-10-09 DIAGNOSIS — M7918 Myalgia, other site: Secondary | ICD-10-CM

## 2015-10-09 DIAGNOSIS — G43709 Chronic migraine without aura, not intractable, without status migrainosus: Secondary | ICD-10-CM

## 2015-10-09 DIAGNOSIS — M791 Myalgia: Secondary | ICD-10-CM | POA: Diagnosis not present

## 2015-10-09 DIAGNOSIS — F4323 Adjustment disorder with mixed anxiety and depressed mood: Secondary | ICD-10-CM | POA: Diagnosis not present

## 2015-10-09 MED ORDER — GABAPENTIN 100 MG PO CAPS: 100.0000 mg | ORAL_CAPSULE | Freq: Three times a day (TID) | ORAL | Status: DC
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
## 2015-10-09 NOTE — Patient Instructions (Signed)
Start with 100 mg dose of gabapentin  Take 1 pill at bedtime each day for 3 days  Then 1 pill am and pm for 3 days  Then 1 pill three times daily for 2 weeks  At that point let me know how you are doing - call and let me know and we will discuss whether to advance dose  If any intolerable side effects or you feel worse-please let me know   I think that over time this will help anxiety, body aches and pains and headaches - it will take a little while Stay as active as you can be Let the sun /light in more  Get outdoors when you can  Socialize as much as you feel comfortable Write in a journal since you are not going to do counseling  Follow up in 6-8 weeks

## 2015-10-09 NOTE — Assessment & Plan Note (Signed)
Reviewed stressors/ coping techniques/symptoms/ support sources/ tx options and side effects in detail today  Husband here as well today  Given myofascial pain syndrome and migraines as well - will start by re introducing gabapentin which helped in the past Discussed expectations of this medication including time to effectiveness and mechanism of action, also poss of side effects (early and late)- including mental fuzziness, weight or appetite change, nausea and poss of worse dep or anxiety (even suicidal thoughts)  Pt voiced understanding and will stop med and update if this occurs   Start with 100 mg and titrate up - will call in 2 weeks and if doing will inc to 300  F/u 6-8 wk Declines counseling unfortunately No doubt some PTSD in addition for her other symptoms Given suggestions incl using journal  >25 minutes spent in face to face time with patient, >50% spent in counselling or coordination of care

## 2015-10-09 NOTE — Assessment & Plan Note (Signed)
Will try gabapentin again which worked well previously Discussed expectations of this  medication including time to effectiveness and mechanism of action, also poss of side effects (early and late)- including mental fuzziness, weight or appetite change, nausea and poss of worse dep or anxiety (even suicidal thoughts)  Pt voiced understanding and will stop med and update if this occurs   Will start with 100 mg- titrate to tid and then adv to 300 if doing well in 2 wk (pt will update) Enc low impact exercise as well  Suspect this will also help anxeity

## 2015-10-09 NOTE — Progress Notes (Signed)
Pre visit review using our clinic review tool, if applicable. No additional management support is needed unless otherwise documented below in the visit note. 

## 2015-10-09 NOTE — Assessment & Plan Note (Signed)
Trial again of gabapentin for myofacial pain and anxiety - it may also help HA  Disc habits F/u planned

## 2015-10-16 ENCOUNTER — Encounter: Payer: Self-pay | Admitting: Family Medicine

## 2015-10-16 ENCOUNTER — Ambulatory Visit (INDEPENDENT_AMBULATORY_CARE_PROVIDER_SITE_OTHER): Payer: BLUE CROSS/BLUE SHIELD | Admitting: Family Medicine

## 2015-10-16 VITALS — BP 124/82 | HR 86 | Temp 97.4°F | Ht 62.5 in | Wt 171.5 lb

## 2015-10-16 DIAGNOSIS — J069 Acute upper respiratory infection, unspecified: Secondary | ICD-10-CM | POA: Diagnosis not present

## 2015-10-16 DIAGNOSIS — R339 Retention of urine, unspecified: Secondary | ICD-10-CM | POA: Diagnosis not present

## 2015-10-16 DIAGNOSIS — N3000 Acute cystitis without hematuria: Secondary | ICD-10-CM | POA: Diagnosis not present

## 2015-10-16 DIAGNOSIS — B9789 Other viral agents as the cause of diseases classified elsewhere: Secondary | ICD-10-CM

## 2015-10-16 DIAGNOSIS — N390 Urinary tract infection, site not specified: Secondary | ICD-10-CM | POA: Insufficient documentation

## 2015-10-16 LAB — POC URINALSYSI DIPSTICK (AUTOMATED)
Bilirubin, UA: NEGATIVE
Blood, UA: NEGATIVE
GLUCOSE UA: NEGATIVE
Ketones, UA: NEGATIVE
NITRITE UA: NEGATIVE
Spec Grav, UA: >=1.03
UROBILINOGEN UA: 0.2
pH, UA: 6

## 2015-10-16 MED ORDER — SULFAMETHOXAZOLE-TRIMETHOPRIM 800-160 MG PO TABS: 1.0000 | ORAL_TABLET | Freq: Two times a day (BID) | ORAL | Status: DC

## 2015-10-16 NOTE — Patient Instructions (Signed)
Drink a lot of water  Take the bactrim for uti as directed  For your cold- rest and drink lots of fluids You can try nasal saline spray- to help irrigate sinuses  Robitussin or mucinex for cough and congestion are helpful Tylenol or aleve for aches or fever   Update if not starting to improve in a week or if worsening  -especially if you develop sinus pain and green yellow nasal discharge or much worse cough or shortness of breath

## 2015-10-16 NOTE — Progress Notes (Signed)
Subjective:    Patient ID: Sarah Case, female    DOB: July 23, 1956, 60 y.o.   MRN: [id number]  HPI Here for pain in low abdomen and also uri symptoms   Nasal congestion and running -- not a lot of nasal blowing - more pnd and scratchy throat  Hacking cough -dry / starting to rattle  Low grade fever if any  Worn out  Some facial pain  Took some aleve over the counter  Also some robitussin CF max cough syrup     Low abd pain and low back pain and pressure- started last friday Uncomfortable to urinate  Urine seems darker - she thinks she drinks a lot of fluids  Drinking some cranberry juice  Some nausea-no vomiting  Has to strain to empty bladder all the way- this is what happens when she gets a uti   Results for orders placed or performed in visit on 10/16/15  POCT Urinalysis Dipstick (Automated)  Result Value Ref Range   Color, UA Yellow    Clarity, UA Hazy    Glucose, UA Neg.    Bilirubin, UA Neg.    Ketones, UA Neg.    Spec Grav, UA >=1.030    Blood, UA Neg.    pH, UA 6.0    Protein, UA Trace    Urobilinogen, UA 0.2    Nitrite, UA Neg.    Leukocytes, UA small (1+) (A) Negative      Patient Active Problem List   Diagnosis Date Noted  . Adjustment disorder with mixed anxiety and depressed mood 08/23/2014  . Urinary hesitancy 08/22/2014  . Microscopic hematuria 08/22/2014  . Myofascial pain syndrome 07/11/2014  . Pain, joint, multiple sites 07/04/2014  . Fatigue 07/04/2014  . Dysthymia 07/04/2014  . Elevated liver enzymes 10/25/2013  . Abnormal mammogram 10/25/2013  . Colon cancer screening 10/25/2013  . Gallbladder polyp 10/25/2013  . Thirst 10/21/2013  . Pain in joint, ankle and foot 10/21/2013  . Routine general medical examination at a health care facility 10/21/2013  . Other screening mammogram 10/21/2013  . OTHER SPECIFIED DISORDER OF GALLBLADDER 05/30/2008  . IRRITABLE BOWEL SYNDROME 04/19/2008  . GERD 11/08/2007  . PEPTIC ULCER DISEASE  11/08/2007  . FIBROCYSTIC BREAST DISEASE 11/08/2007  . INSOMNIA 11/08/2007  . Migraines 11/08/2007   Past Medical History  Diagnosis Date  . History of hay fever   . History of stomach ulcers   . History of asthma   . History of arthritis   . History of chicken pox   . History of depression   . History of migraine   . History of UTI   . History of venereal warts     on hands   Past Surgical History  Procedure Laterality Date  . Partial hysterectomy  01/1998   Social History  Substance Use Topics  . Smoking status: Never Smoker   . Smokeless tobacco: Never Used  . Alcohol Use: No   Family History  Problem Relation Age of Onset  . Arthritis Mother   . Other Mother     Blood clots  . Lung cancer Sister   . Mental illness Mother   . Mental illness Sister   . Diabetes Father    No Known Allergies Current Outpatient Prescriptions on File Prior to Visit  Medication Sig Dispense Refill  . Ascorbic Acid (VITAMIN C) 1000 MG tablet Take 1,000 mg by mouth daily.    Marland Kitchen b complex vitamins tablet Take 1 tablet by mouth  daily.    . calcium carbonate 200 MG capsule Take 250 mg by mouth daily.    . Calcium-Magnesium-Vitamin D (CALCIUM 1200+D3 PO) Take 1 capsule by mouth daily.    Marland Kitchen gabapentin (NEURONTIN) 100 MG capsule Take 1 capsule (100 mg total) by mouth 3 (three) times daily. 90 capsule 3  . MAGNESIUM CITRATE PO Take 1 tablet by mouth daily.    Marland Kitchen POTASSIUM PO Take 1 tablet by mouth daily.     No current facility-administered medications on file prior to visit.    Review of Systems Review of Systems  Constitutional: Negative for fever, appetite change,  and unexpected weight change.  pos for malaise Eyes: Negative for pain and visual disturbance.  ENT pos for cong/rhinorrhea/ST  Respiratory: Negative for wheeze and shortness of breath.   Cardiovascular: Negative for cp or palpitations    Gastrointestinal: Negative for nausea, diarrhea and constipation.  Genitourinary: posfor  urgency and frequency. neg for hematuria  Skin: Negative for pallor or rash   Neurological: Negative for weakness, light-headedness, numbness and headaches.  Hematological: Negative for adenopathy. Does not bruise/bleed easily.  Psychiatric/Behavioral: Negative for dysphoric mood. The patient is not nervous/anxious.         Objective:   Physical Exam  Constitutional: She appears well-developed and well-nourished. No distress.  obese and well appearing   HENT:  Head: Normocephalic and atraumatic.  Right Ear: External ear normal.  Left Ear: External ear normal.  Mouth/Throat: Oropharynx is clear and moist. No oropharyngeal exudate.  Nares are injected and congested  No sinus tenderness Clear rhinorrhea and post nasal drip   Eyes: Conjunctivae and EOM are normal. Pupils are equal, round, and reactive to light. Right eye exhibits no discharge. Left eye exhibits no discharge.  Neck: Normal range of motion. Neck supple.  Cardiovascular: Normal rate, regular rhythm and normal heart sounds.   Pulmonary/Chest: Effort normal and breath sounds normal. No respiratory distress. She has no wheezes. She has no rales. She exhibits no tenderness.  Abdominal: Soft. Bowel sounds are normal. She exhibits no distension. There is tenderness. There is no rebound.  No cva tenderness  Mild suprapubic tenderness  Musculoskeletal: She exhibits no edema.  Lymphadenopathy:    She has no cervical adenopathy.  Neurological: She is alert.  Skin: Skin is warm and dry. No rash noted.  Psychiatric: She has a normal mood and affect.          Assessment & Plan:   Problem List Items Addressed This Visit      Respiratory   Viral URI with cough    Disc symptomatic care - see instructions on AVS  Reassuring exam Enc use of nasal saline Update if not starting to improve in a week or if worsening        Relevant Medications   sulfamethoxazole-trimethoprim (BACTRIM DS,SEPTRA DS) 800-160 MG tablet      Genitourinary   UTI (urinary tract infection) - Primary    Mildly pos ua- has been drinking cranberry juice tx with bactrim Enc fluids cx pending   Update if not starting to improve in a week or if worsening        Relevant Medications   sulfamethoxazole-trimethoprim (BACTRIM DS,SEPTRA DS) 800-160 MG tablet   Other Relevant Orders   Urine culture    Other Visit Diagnoses    Retention of urine        Relevant Orders    POCT Urinalysis Dipstick (Automated) (Completed)

## 2015-10-16 NOTE — Assessment & Plan Note (Signed)
Mildly pos ua- has been drinking cranberry juice tx with bactrim Enc fluids cx pending   Update if not starting to improve in a week or if worsening

## 2015-10-16 NOTE — Assessment & Plan Note (Signed)
Disc symptomatic care - see instructions on AVS  Reassuring exam Enc use of nasal saline Update if not starting to improve in a week or if worsening

## 2015-10-16 NOTE — Progress Notes (Signed)
Pre visit review using our clinic review tool, if applicable. No additional management support is needed unless otherwise documented below in the visit note. 

## 2015-10-18 LAB — URINE CULTURE: Colony Count: 25000

## 2015-10-29 ENCOUNTER — Telehealth: Payer: Self-pay

## 2015-10-29 MED ORDER — GABAPENTIN 300 MG PO CAPS: 300.0000 mg | ORAL_CAPSULE | Freq: Three times a day (TID) | ORAL | Status: DC

## 2015-10-29 NOTE — Telephone Encounter (Signed)
I will increase her gabapentin-the directions will say take 300 mg tid but tell her the following Week one take 100 am 100 mid day and 300 bedtime Week two take 100 am 300 mid day and 300 bedtime  Week three (if doing ok) take 300 mg am, mid day and pm  If at any point this is too sedating -wait longer to progress to the next dose   A urology consult would be a good idea for urinary symptoms- let me know when she is ready for this   Let me know how it goes with the gabapentin

## 2015-10-29 NOTE — Telephone Encounter (Signed)
Pt left v/m; pt was seen 10/16/15 and has finished abx given for UTI last week; pt was seen at [facility] walk in clinic 10/20/15 for bronchitis and finished abx this morning. still having dry cough,head and chest congestion, low grade fever,no wheezing or SOB and no S/T. Today pt has urinary pressure and pain upon urination, bloated feeling. Pt wants to have gabapentin increased; presently pt taking gabapentin 100 mg; one tab tid; pt is still hurting and wants dosage increased.[facility]. Pt does not want to schedule appt since seen on 10/16/15 and then walk in clinic on 10/20/15.Please advise. Pt request cb.

## 2015-10-29 NOTE — Telephone Encounter (Signed)
Pt notified of Dr. Royden Purl instructions and verbalized understanding. Pt will think about urologist referral and let us know

## 2015-10-30 NOTE — Telephone Encounter (Signed)
The loose stools are probably from the antibiotic and the blood could be from a hemorrhoid-however if symptoms persist please f/u =we may need to examine her and do a stool test If symptoms worsen or abd pain or fever-also f/u

## 2015-10-30 NOTE — Telephone Encounter (Signed)
appt scheduled

## 2015-10-30 NOTE — Telephone Encounter (Signed)
Please f/u -may need a cxr in addition to being seen to eval further

## 2015-10-30 NOTE — Telephone Encounter (Signed)
Pt notified of Dr. Royden Purl comments and verbalized understanding.  Pt wanted me to let Dr. Milinda Antis know that the abx didn't help her sxs she finished them today, but she still has cough, congestion, and now it feels like the congestion has moved more to her chest, pt is worried about getting pneumonia and wants to know what should she do, please advise

## 2015-10-30 NOTE — Telephone Encounter (Signed)
Pt requesting a cb regarding stool  [phone]

## 2015-10-30 NOTE — Telephone Encounter (Signed)
Pt stools have been loose for a few days, pt thought it was due to abx use but the last 2 days pt has had loose stools that look like they could have blood in it (bright red) pt didn't know if this is a side eff of one of the meds or should she be worried, please advise

## 2015-10-31 ENCOUNTER — Ambulatory Visit (INDEPENDENT_AMBULATORY_CARE_PROVIDER_SITE_OTHER): Payer: BLUE CROSS/BLUE SHIELD | Admitting: Family Medicine

## 2015-10-31 ENCOUNTER — Encounter: Payer: Self-pay | Admitting: Family Medicine

## 2015-10-31 VITALS — BP 128/84 | HR 82 | Temp 98.2°F | Ht 62.5 in | Wt 170.8 lb

## 2015-10-31 DIAGNOSIS — J069 Acute upper respiratory infection, unspecified: Secondary | ICD-10-CM

## 2015-10-31 DIAGNOSIS — R197 Diarrhea, unspecified: Secondary | ICD-10-CM | POA: Diagnosis not present

## 2015-10-31 DIAGNOSIS — B9789 Other viral agents as the cause of diseases classified elsewhere: Secondary | ICD-10-CM

## 2015-10-31 DIAGNOSIS — R3911 Hesitancy of micturition: Secondary | ICD-10-CM | POA: Diagnosis not present

## 2015-10-31 LAB — POC URINALSYSI DIPSTICK (AUTOMATED)
Bilirubin, UA: NEGATIVE
Blood, UA: NEGATIVE
Glucose, UA: NEGATIVE
Ketones, UA: NEGATIVE
LEUKOCYTES UA: NEGATIVE
NITRITE UA: NEGATIVE
PROTEIN UA: NEGATIVE
Urobilinogen, UA: 0.2
pH, UA: 6

## 2015-10-31 LAB — COMPREHENSIVE METABOLIC PANEL
ALK PHOS: 73 U/L (ref 39–117)
ALT: 35 U/L (ref 0–35)
AST: 33 U/L (ref 0–37)
Albumin: 4.1 g/dL (ref 3.5–5.2)
BILIRUBIN TOTAL: 0.5 mg/dL (ref 0.2–1.2)
BUN: 14 mg/dL (ref 6–23)
CALCIUM: 9.3 mg/dL (ref 8.4–10.5)
CO2: 31 mEq/L (ref 19–32)
CREATININE: 0.6 mg/dL (ref 0.40–1.20)
Chloride: 104 mEq/L (ref 96–112)
GFR: 107.92 mL/min (ref >60.00)
Glucose, Bld: 97 mg/dL (ref 70–99)
Potassium: 3.2 mEq/L — ABNORMAL LOW (ref 3.5–5.1)
Sodium: 141 mEq/L (ref 135–145)
TOTAL PROTEIN: 6.9 g/dL (ref 6.0–8.3)

## 2015-10-31 LAB — CBC WITH DIFFERENTIAL/PLATELET
BASOS ABS: 0 10*3/uL (ref 0.0–0.1)
Basophils Relative: 0.6 % (ref 0.0–3.0)
Eosinophils Absolute: 0.1 10*3/uL (ref 0.0–0.7)
Eosinophils Relative: 1.2 % (ref 0.0–5.0)
HEMATOCRIT: 41.8 % (ref 36.0–46.0)
HEMOGLOBIN: 14.1 g/dL (ref 12.0–15.0)
LYMPHS PCT: 33.2 % (ref 12.0–46.0)
Lymphs Abs: 2.5 10*3/uL (ref 0.7–4.0)
MCHC: 33.7 g/dL (ref 30.0–36.0)
MCV: 86.4 fl (ref 78.0–100.0)
MONOS PCT: 6.7 % (ref 3.0–12.0)
Monocytes Absolute: 0.5 10*3/uL (ref 0.1–1.0)
NEUTROS ABS: 4.5 10*3/uL (ref 1.4–7.7)
Neutrophils Relative %: 58.3 % (ref 43.0–77.0)
Platelets: 215 10*3/uL (ref 150.0–400.0)
RBC: 4.83 Mil/uL (ref 3.87–5.11)
RDW: 13.3 % (ref 11.5–15.5)
WBC: 7.6 10*3/uL (ref 4.0–10.5)

## 2015-10-31 NOTE — Patient Instructions (Signed)
Your upper respiratory infection is getting better- lungs sound good  Drink fluids Update if not starting to improve in a week or if worsening    For diarrhea- labs /stool test for C diff and stool blood test   For bladder issues-stop at check out for referral to urology

## 2015-10-31 NOTE — Progress Notes (Signed)
Pre visit review using our clinic review tool, if applicable. No additional management support is needed unless otherwise documented below in the visit note. 

## 2015-10-31 NOTE — Progress Notes (Signed)
Subjective:    Patient ID: Sarah Case, female    DOB: 1956-02-06, 60 y.o.   MRN: [id number]  HPI Here for several c/o   We saw her and tx with bactrim for uti/uri Had to go to [facility] 2/4 and dx with bronchitis and given abx which she finished on mon am - cefidinr  Cough is improved - still raspy in the chest  Still congested in head and chest  pnd  Phlegm from nose is slightly yellow  Cough is no longer productive    Overall much improved Low grade temp if any (chills) No sob    We treated for presumed uti  cx showed multiple types of bacteria  ? Contamination  Took bactrim  Urine symptoms kept getting worse -no improvement at all  Now has lower abd/pelvic pressure-rad to back  A little dysuria No blood in urine  Some flank discomfort -more on the L  Has had chills  Some nausea occ   UA clear today  No vaginal d/c or itching  Has had a partial hysterectomy Thinks bladder is falling  Urinary frequency and urgency are normal for her  Also some urinary hesitancy   Diarrhea - loose stool- improved  Has a red tint - to her stool (thinks it was from the last abx) Stools were frequent - now just in the am  No rectal pain   Colonoscopy 2015- reassuring    Patient Active Problem List   Diagnosis Date Noted  . Diarrhea 10/31/2015  . Viral URI with cough 10/16/2015  . UTI (urinary tract infection) 10/16/2015  . Adjustment disorder with mixed anxiety and depressed mood 08/23/2014  . Urinary hesitancy 08/22/2014  . Microscopic hematuria 08/22/2014  . Myofascial pain syndrome 07/11/2014  . Pain, joint, multiple sites 07/04/2014  . Fatigue 07/04/2014  . Dysthymia 07/04/2014  . Elevated liver enzymes 10/25/2013  . Abnormal mammogram 10/25/2013  . Colon cancer screening 10/25/2013  . Gallbladder polyp 10/25/2013  . Thirst 10/21/2013  . Pain in joint, ankle and foot 10/21/2013  . Routine general medical examination at a health care facility 10/21/2013    . Other screening mammogram 10/21/2013  . OTHER SPECIFIED DISORDER OF GALLBLADDER 05/30/2008  . IRRITABLE BOWEL SYNDROME 04/19/2008  . GERD 11/08/2007  . PEPTIC ULCER DISEASE 11/08/2007  . FIBROCYSTIC BREAST DISEASE 11/08/2007  . INSOMNIA 11/08/2007  . Migraines 11/08/2007   Past Medical History  Diagnosis Date  . History of hay fever   . History of stomach ulcers   . History of asthma   . History of arthritis   . History of chicken pox   . History of depression   . History of migraine   . History of UTI   . History of venereal warts     on hands   Past Surgical History  Procedure Laterality Date  . Partial hysterectomy  01/1998   Social History  Substance Use Topics  . Smoking status: Never Smoker   . Smokeless tobacco: Never Used  . Alcohol Use: No   Family History  Problem Relation Age of Onset  . Arthritis Mother   . Other Mother     Blood clots  . Lung cancer Sister   . Mental illness Mother   . Mental illness Sister   . Diabetes Father    No Known Allergies Current Outpatient Prescriptions on File Prior to Visit  Medication Sig Dispense Refill  . Ascorbic Acid (VITAMIN C) 1000 MG tablet Take 1,000  mg by mouth daily.    Marland Kitchen b complex vitamins tablet Take 1 tablet by mouth daily.    . calcium carbonate 200 MG capsule Take 250 mg by mouth daily.    . Calcium-Magnesium-Vitamin D (CALCIUM 1200+D3 PO) Take 1 capsule by mouth daily.    Marland Kitchen gabapentin (NEURONTIN) 300 MG capsule Take 1 capsule (300 mg total) by mouth 3 (three) times daily. 90 capsule 3  . MAGNESIUM CITRATE PO Take 1 tablet by mouth daily.    Marland Kitchen POTASSIUM PO Take 1 tablet by mouth daily.     No current facility-administered medications on file prior to visit.     Review of Systems  Constitutional: Positive for appetite change and fatigue. Negative for fever.  HENT: Positive for congestion, postnasal drip, rhinorrhea, sinus pressure, sneezing and sore throat. Negative for ear pain.   Eyes: Negative  for pain and discharge.  Respiratory: Positive for cough. Negative for shortness of breath, wheezing and stridor.   Cardiovascular: Negative for chest pain.  Gastrointestinal: Positive for abdominal pain and diarrhea. Negative for nausea, vomiting and rectal pain.  Genitourinary: Positive for frequency and difficulty urinating. Negative for urgency, hematuria, flank pain, decreased urine volume, vaginal bleeding, vaginal discharge, enuresis and vaginal pain.  Musculoskeletal: Negative for myalgias and arthralgias.  Skin: Negative for rash.  Neurological: Positive for headaches. Negative for dizziness, weakness and light-headedness.  Psychiatric/Behavioral: Negative for confusion and dysphoric mood.       Objective:   Physical Exam  Constitutional: She appears well-developed and well-nourished. No distress.  Well appearing   HENT:  Head: Normocephalic and atraumatic.  Right Ear: External ear normal.  Left Ear: External ear normal.  Mouth/Throat: Oropharynx is clear and moist.  Nares are injected and congested  No sinus tenderness Clear rhinorrhea and post nasal drip   Eyes: Conjunctivae and EOM are normal. Pupils are equal, round, and reactive to light. Right eye exhibits no discharge. Left eye exhibits no discharge. No scleral icterus.  Neck: Normal range of motion. Neck supple.  Cardiovascular: Normal rate, regular rhythm and normal heart sounds.   Pulmonary/Chest: Effort normal and breath sounds normal. No respiratory distress. She has no wheezes. She has no rales. She exhibits no tenderness.  Harsh bs  No rales or rhonchi or wheeze   Abdominal: Soft. Bowel sounds are normal. She exhibits no distension. There is no hepatosplenomegaly. There is tenderness in the right lower quadrant, suprapubic area and left lower quadrant. There is no rigidity, no rebound, no guarding and no CVA tenderness.  No cva tenderness  Mild suprapubic tenderness  Genitourinary:  Bimanual exam-no M or  tenderness Mild cystocele  Nl app urethra   Musculoskeletal: She exhibits no edema.  Lymphadenopathy:    She has no cervical adenopathy.  Neurological: She is alert.  Skin: Skin is warm and dry. No rash noted.  Psychiatric: She has a normal mood and affect.          Assessment & Plan:   Problem List Items Addressed This Visit      Respiratory   Viral URI with cough    After tx with bactrim and then cefdinir is improving Expect cough to last a bit longer -1-2 wk poss Disc symptomatic care - see instructions on AVS Update if not starting to improve in a week or if worsening   Reassuring exam today        Other   Diarrhea    S/p several abx -gradually improving  Check c diff test  Also heme cards - pt desc red colored stool at times (? If discolored or blood)  Update if symptoms worsen Lab today  Enc fluid intake to prev dehydration       Relevant Orders   C. difficile GDH and Toxin A/B   CBC with Differential/Platelet (Completed)   Comprehensive metabolic panel (Completed)   Hemoccult Cards (X3 cards)   Urinary hesitancy - Primary    Neg ux -was tx empirically with bactrim Neg ua today Enc fluids  Mild cystocele on exam   uti symptoms and symptoms of retention continue (suspect incomplete emptying) Ref to urology for further eval       Relevant Orders   POCT Urinalysis Dipstick (Automated) (Completed)   Ambulatory referral to Urology

## 2015-11-01 NOTE — Assessment & Plan Note (Signed)
S/p several abx -gradually improving  Check c diff test  Also heme cards - pt desc red colored stool at times (? If discolored or blood)  Update if symptoms worsen Lab today  Enc fluid intake to prev dehydration

## 2015-11-01 NOTE — Assessment & Plan Note (Signed)
After tx with bactrim and then cefdinir is improving Expect cough to last a bit longer -1-2 wk poss Disc symptomatic care - see instructions on AVS Update if not starting to improve in a week or if worsening   Reassuring exam today

## 2015-11-01 NOTE — Addendum Note (Signed)
Addended by: Alvina Chou on: 11/01/2015 02:05 PM   Modules accepted: Orders

## 2015-11-01 NOTE — Assessment & Plan Note (Signed)
Neg ux -was tx empirically with bactrim Neg ua today Enc fluids  Mild cystocele on exam   uti symptoms and symptoms of retention continue (suspect incomplete emptying) Ref to urology for further eval

## 2015-11-02 ENCOUNTER — Telehealth: Payer: Self-pay

## 2015-11-02 LAB — C. DIFFICILE GDH AND TOXIN A/B
C. DIFF TOXIN A/B: NOT DETECTED
C. difficile GDH: NOT DETECTED

## 2015-11-02 NOTE — Telephone Encounter (Signed)
Pt left v/m; pt was seen on 10/31/15 and given hemoccult cards; pt had BM and was more normal and did not see any blood and thinks could have been from hemorrhoid. Pt does not think needs to complete hemoccult cards. Pt request cb.

## 2015-11-02 NOTE — Addendum Note (Signed)
Addended by: Shon Millet on: 11/02/2015 03:40 PM   Modules accepted: Orders

## 2015-11-02 NOTE — Telephone Encounter (Signed)
That is up to her - if her symptoms are gone and she feels fine

## 2015-11-02 NOTE — Telephone Encounter (Signed)
Pt said she is feeling fine, no BM issues at all so orders for hemoccult cards cancelled and Dr. Milinda Antis aware

## 2015-11-02 NOTE — Telephone Encounter (Signed)
Left voicemail requesting pt to call office back 

## 2015-11-06 ENCOUNTER — Ambulatory Visit: Payer: Self-pay | Admitting: Urology

## 2015-11-20 ENCOUNTER — Ambulatory Visit: Payer: BLUE CROSS/BLUE SHIELD | Admitting: Family Medicine

## 2016-01-15 MED ORDER — GABAPENTIN (ONCE-DAILY) 600 MG PO TABS: 600.0000 mg | ORAL_TABLET | Freq: Three times a day (TID) | ORAL | Status: DC

## 2016-01-15 NOTE — Telephone Encounter (Signed)
Pt left v/m requesting cb about gabapentin. 

## 2016-01-15 NOTE — Addendum Note (Signed)
Addended by: Roxy MannsWER, MARNE A on: 01/15/2016 01:11 PM   Modules accepted: Orders, Medications

## 2016-01-15 NOTE — Telephone Encounter (Signed)
Left voicemail requesting pt to call office back 

## 2016-01-15 NOTE — Telephone Encounter (Signed)
Pt notified Rx sent to pharmacy and pt advised of Dr. Royden Purlower's instructions and advise how to increase med gradually, pt verbalized understanding

## 2016-01-15 NOTE — Telephone Encounter (Addendum)
Pt left /vm; pt taking 900 mg of gabapentin a day and pt wants to know if could be increased; gabapentin 900 mg daily is not working; pt is always in pain. Pt request cb. [facility].

## 2016-01-15 NOTE — Telephone Encounter (Signed)
I think she is currently taking 300 mg tid and I am comfortable going up to 600 mg tid  Week one take 300 am and mid day and 600 pm Week two 300 am 600 mid day and 600 pm Then 600 tid Watch out for sedation or dizziness as she gets used to it and let me know how she does [facility]

## 2016-01-16 ENCOUNTER — Telehealth: Payer: Self-pay

## 2016-01-16 NOTE — Telephone Encounter (Signed)
Pt left v/m that [facility] pharmacy contacted pt and ins will not approve gabapentin. Pharmacy is supposed to contact Dr Royden Purlower's office. Pt request cb.

## 2016-01-16 NOTE — Telephone Encounter (Signed)
Pt called back Best number [phone]

## 2016-01-17 MED ORDER — GABAPENTIN 600 MG PO TABS
600.0000 mg | ORAL_TABLET | Freq: Three times a day (TID) | ORAL | Status: DC
Start: 2016-01-17 — End: 2016-06-24

## 2016-01-17 NOTE — Telephone Encounter (Signed)
Received fax saying prior Berkley Harveyauth is needed for the 600mg  gabapentin. Called insurance and the rep advise me that her pharmacy tried to run the Rx through as a capsule not a tablet and they only cover the tablets, she looked at the Rx and she said if pt's pharmacy runs the Rx as a tablet then pt will only have to pay $13 and a PA is not needed, called pharmacy and left voicemail letting them know what pt's insurance said. Called pt and advise her of what her insurance said as well

## 2016-01-17 NOTE — Addendum Note (Signed)
Addended by: Shon MilletWATLINGTON, Paidyn Mcferran M on: 01/17/2016 04:48 PM   Modules accepted: Orders, Medications

## 2016-01-17 NOTE — Telephone Encounter (Signed)
Dr. Milinda Antisower sent in name brand not generic, insurance only pays for the generic, generic sent and pharmacy aware

## 2016-01-17 NOTE — Telephone Encounter (Signed)
[facility]  Please call Raynelle FanningJulie back at [phone].

## 2016-06-10 ENCOUNTER — Ambulatory Visit (INDEPENDENT_AMBULATORY_CARE_PROVIDER_SITE_OTHER): Payer: BLUE CROSS/BLUE SHIELD | Admitting: Internal Medicine

## 2016-06-10 ENCOUNTER — Encounter: Payer: Self-pay | Admitting: Internal Medicine

## 2016-06-10 ENCOUNTER — Telehealth: Payer: Self-pay | Admitting: Family Medicine

## 2016-06-10 VITALS — BP 126/82 | HR 81 | Temp 98.7°F | Wt 170.2 lb

## 2016-06-10 DIAGNOSIS — Z111 Encounter for screening for respiratory tuberculosis: Secondary | ICD-10-CM

## 2016-06-10 DIAGNOSIS — J069 Acute upper respiratory infection, unspecified: Secondary | ICD-10-CM | POA: Diagnosis not present

## 2016-06-10 DIAGNOSIS — B9789 Other viral agents as the cause of diseases classified elsewhere: Principal | ICD-10-CM

## 2016-06-10 MED ORDER — HYDROCODONE-HOMATROPINE 5-1.5 MG/5ML PO SYRP: 5.0000 mL | ORAL_SOLUTION | Freq: Three times a day (TID) | ORAL | 0 refills | Status: DC | PRN

## 2016-06-10 NOTE — Addendum Note (Signed)
Addended by: Roena MaladyEVONTENNO, Leyla Soliz Y on: 06/10/2016 11:32 AM   Modules accepted: Orders

## 2016-06-10 NOTE — Telephone Encounter (Signed)
Pt has appt 06/10/16 at 10:30 with Pamala Hurry Baity NP.

## 2016-06-10 NOTE — Telephone Encounter (Signed)
Patient Name: Sarah GrimeMARILYN Case DOB: 12/16/1955 Initial Comment Caller states she has a cold. Hacking cough. She's having pain from the top of her head, all the way down. Trouble walking, it's dragging. Little wheezing. Nurse Assessment Nurse: Charna Elizabethrumbull, RN, Cathy Date/Time (Eastern Time): 06/10/2016 8:19:05 AM Confirm and document reason for call. If symptomatic, describe symptoms. You must click the next button to save text entered. ---[name] states she is not having severe breathing difficulty. She developed cough/cold symptoms with wheezing about a week ago. She developed another Migraine this morning (rated as 2 on the 1 to 10 scale). No injury in the past 3 days. No fever. Alert and responsive. She developed lower, bilateral abdominal pain this morning (rated as a 4) and lower back pain this morning (rated as a 4). Has the patient traveled out of the country within the last 30 days? ---No Does the patient have any new or worsening symptoms? ---Yes Will a triage be completed? ---Yes Related visit to physician within the last 2 weeks? ---No Does the PT have any chronic conditions? (i.e. diabetes, asthma, etc.) ---Yes List chronic conditions. ---Migraine, Chronic left side pain Is this a behavioral health or substance abuse call? ---No Guidelines Guideline Title Affirmed Question Affirmed Notes Cough - Acute Productive Wheezing is present Abdominal Pain - Female Age > 60 years Back Pain [1] Abdominal pain AND [2] age > 7660 Headache [1] New headache AND [2] age > 8250 Final Disposition User Go to ED Now (or PCP triage) Charna Elizabethrumbull, RN, Cathy Comments Scheduled 10:30am appointment with Sampson SiBaity, NP. Disagree/Comply: Comply

## 2016-06-10 NOTE — Patient Instructions (Signed)

## 2016-06-10 NOTE — Progress Notes (Signed)
HPI  Pt presents to the clinic today with c/o headache, ear fullness, runny nose, cough and chest congestion. She reports this started 1 week ago. The headaches is all over her forehead. She describes the pain as throbbing. She denies decreased hearing or ringing in the ears. She is blowing clear mucous out of her nose. The cough is productive of white mucous. She denies shortness of breath. She denies fever, chills or body aches. She has tried ibuprofen and cough syrup with minimal relief. She report she has no history of seasonal allergies. She has not had sick contacts that she is aware of.   Review of Systems      Past Medical History:  Diagnosis Date  . History of arthritis   . History of asthma   . History of chicken pox   . History of depression   . History of hay fever   . History of migraine   . History of stomach ulcers   . History of UTI   . History of venereal warts    on hands    Family History  Problem Relation Age of Onset  . Arthritis Mother   . Other Mother     Blood clots  . Mental illness Mother   . Diabetes Father   . Lung cancer Sister   . Mental illness Sister     Social History   Social History  . Marital status: Married    Spouse name: N/A  . Number of children: N/A  . Years of education: N/A   Occupational History  . Not on file.   Social History Main Topics  . Smoking status: Never Smoker  . Smokeless tobacco: Never Used  . Alcohol use No  . Drug use: No  . Sexual activity: Not on file   Other Topics Concern  . Not on file   Social History Narrative  . No narrative on file    No Known Allergies   Constitutional: Positive headache. Denies fatigue, fever or  abrupt weight changes.  HEENT:  Positive ear fullness, runny nose. Denies eye redness, eye pain, pressure behind the eyes, facial pain, nasal congestion, ear pain, ringing in the ears, wax buildup, or bloody nose. Respiratory: Positive cough. Denies difficulty breathing or  shortness of breath.  Cardiovascular: Denies chest pain, chest tightness, palpitations or swelling in the hands or feet.   No other specific complaints in a complete review of systems (except as listed in HPI above).  Objective:   BP 126/82   Pulse 81   Temp 98.7 F (37.1 C) (Oral)   Wt 170 lb 4 oz (77.2 kg)   SpO2 98%   BMI 30.64 kg/m  Wt Readings from Last 3 Encounters:  06/10/16 170 lb 4 oz (77.2 kg)  10/31/15 170 lb 12 oz (77.5 kg)  10/16/15 171 lb 8 oz (77.8 kg)     General: Appears her stated age, well developed, well nourished in NAD. HEENT: Head: normal shape and size, no sinus tenderness noted; Eyes: sclera white, no icterus, conjunctiva pink; Ears: Tm's gray and intact, normal light reflex, + serous effusion bilaterally; Nose: mucosa pink and moist, septum midline; Throat/Mouth: + PND. Teeth present, mucosa pink and moist, no exudate noted, no lesions or ulcerations noted.  Neck: No cervical lymphadenopathy.  Cardiovascular: Normal rate and rhythm. S1,S2 noted.  No murmur, rubs or gallops noted.  Pulmonary/Chest: Normal effort and positive vesicular breath sounds. No respiratory distress. No wheezes, rales or ronchi noted.  Assessment & Plan:   Viral Upper Respiratory Infection with cough:  Get some rest and drink plenty of water Start Allegra and Nasocort OTC Rx for Hycodan cough syrup Return precautions discussed  RTC as needed or if symptoms persist.   Nicki Reaper, NP

## 2016-06-12 LAB — TB SKIN TEST
Induration: 0 mm
TB Skin Test: NEGATIVE

## 2016-06-19 ENCOUNTER — Telehealth: Payer: Self-pay

## 2016-06-19 NOTE — Telephone Encounter (Signed)
PLEASE NOTE: All timestamps contained within this report are represented as [place] Standard Time. CONFIDENTIALTY NOTICE: This fax transmission is intended only for the addressee. It contains information that is legally privileged, confidential or otherwise protected from use or disclosure. If you are not the intended recipient, you are strictly prohibited from reviewing, disclosing, copying using or disseminating any of this information or taking any action in reliance on or regarding this information. If you have received this fax in error, please notify us immediately by telephone so that we can arrange for its return to us. Phone: [phone], Toll-Free: [phone], Fax: [phone] Page: 1 of 1 Call Id: [id number] [facility] Client Nonclinical Telephone Record [facility] Client [facility] Day Physician Milinda Antisower, Idamae SchullerMarne - MD Contact Type Call Who Is Calling Patient / Member / Family / Caregiver Caller Name Sarah GrimeMarilyn Schiro Caller Phone Number [phone] Patient Name Sarah GrimeMarilyn Frentz Call Type Message Only Information Provided Reason for Call Request to Schedule Office Appointment Initial Comment Caller States she would like to make an appointment for Tuesday Call Closed By: Bradley FerrisJulia Paris Transaction Date/Time: 06/19/2016 8:34:39 AM (ET)

## 2016-06-19 NOTE — Telephone Encounter (Signed)
Pt has appt already scheduled on 06/24/2016 at 2pm with Dr Milinda Antisower.

## 2016-06-24 ENCOUNTER — Ambulatory Visit (INDEPENDENT_AMBULATORY_CARE_PROVIDER_SITE_OTHER): Payer: BLUE CROSS/BLUE SHIELD | Admitting: Family Medicine

## 2016-06-24 ENCOUNTER — Encounter: Payer: Self-pay | Admitting: Family Medicine

## 2016-06-24 VITALS — BP 110/70 | HR 102 | Temp 98.2°F | Wt 168.5 lb

## 2016-06-24 DIAGNOSIS — R42 Dizziness and giddiness: Secondary | ICD-10-CM | POA: Insufficient documentation

## 2016-06-24 DIAGNOSIS — M791 Myalgia: Secondary | ICD-10-CM

## 2016-06-24 DIAGNOSIS — G4700 Insomnia, unspecified: Secondary | ICD-10-CM

## 2016-06-24 DIAGNOSIS — M7918 Myalgia, other site: Secondary | ICD-10-CM

## 2016-06-24 DIAGNOSIS — R0789 Other chest pain: Secondary | ICD-10-CM | POA: Insufficient documentation

## 2016-06-24 DIAGNOSIS — R1013 Epigastric pain: Secondary | ICD-10-CM

## 2016-06-24 DIAGNOSIS — F430 Acute stress reaction: Secondary | ICD-10-CM | POA: Diagnosis not present

## 2016-06-24 DIAGNOSIS — R000 Tachycardia, unspecified: Secondary | ICD-10-CM | POA: Insufficient documentation

## 2016-06-24 MED ORDER — RANITIDINE HCL 150 MG PO CAPS: 150.0000 mg | ORAL_CAPSULE | Freq: Two times a day (BID) | ORAL | 11 refills | Status: AC

## 2016-06-24 NOTE — Assessment & Plan Note (Signed)
Episodic-and pt is unsure if related to anxiety or her dizzy/pre syncopal spells or chest pressure  Rate of 99 at rest today on EKG  Will ref to cardiology for further eval

## 2016-06-24 NOTE — Patient Instructions (Addendum)
Try over the counter benadryl 25 mg at bedtime for sleep  Stop at check out for referral to cardiology for rapid heartbeat and light headedness  Try ranitidine 150 mg twice daily for stomach issues  Also avoid spicy food and anti inflammatories for your stomach   Let me know how you are doing after your cardiology work up  Then if you are interested in trying cymbalta - then let me know and we will get started with it

## 2016-06-24 NOTE — Progress Notes (Signed)
Pre visit review using our clinic review tool, if applicable. No additional management support is needed unless otherwise documented below in the visit note. 

## 2016-06-24 NOTE — Assessment & Plan Note (Addendum)
This is reproduced on palpation of chest  Not exertional but occ assoc with sob or rapid HR EKG today -reviewed - sinus tach/ poor R wave prog  Pt has myofacial pain disorder as well  Will ref to cardiology for further eval

## 2016-06-24 NOTE — Assessment & Plan Note (Signed)
Likely rel to anx/stress rxn  Will first try benadryl otc  No longer taking gabapentin

## 2016-06-24 NOTE — Assessment & Plan Note (Addendum)
Worse lately with weather change and also stressors/grief Unfortunately gabapentin does not help  Still sense anxiety  Declines lyrica ? If cymbalta would help She is agreeable to try it for this and depression once her other problems are ironed out -will call when she is ready for px Discussed expectations of SNRI medication including time to effectiveness and mechanism of action, also poss of side effects (early and late)- including mental fuzziness, weight or appetite change, nausea and poss of worse dep or anxiety (even suicidal thoughts)  Pt voiced understanding and will stop med and update if this occurs

## 2016-06-24 NOTE — Progress Notes (Signed)
Subjective:    Patient ID: Sarah RubinsMarilyn A Schrieber, female    DOB: 03/10/1956, 60 y.o.   MRN: [id number]  HPI Here for multiple somatic complaints including episodes of dizziness (pre syncope) -light headed (happened before but not as severe)  Felt like "I had to pass out"   First episode was Wednesday - in church and she was getting up -felt like she was spinning  Lasted for a minute (happened twice that day)  Then mild cases through the day - not always positional   Eating regularly - not missing meals  Drinks lots of water   Chest gets tight at times - any time/ not necessarily exertional  A little sob  A little nauseated  Also heartburn and stomach pain (points to epigastric area)- stomach feels bloated -worse when she eats  Does take occ nsaid - ibuprofen (600-800 mg) for pain - perhaps 1-2 times per week  Careful to make sure she has eaten with it   Not taking gabapentin regularly - doe not think it was helping Does not want to go on lyrica    She gets sharp pains in L shoulder when she raises her arm  Both arms hurt but the L is worse   Could be stress related -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 Chest pressure 06/24/2016  . Light headedness 06/24/2016  . Dyspepsia 06/24/2016  . Stress reaction 06/24/2016  . Diarrhea 10/31/2015  . Adjustment disorder with mixed anxiety and depressed mood  08/23/2014  . Urinary hesitancy 08/22/2014  . Microscopic hematuria 08/22/2014  . Myofascial pain syndrome 07/11/2014  . Pain, joint, multiple sites 07/04/2014  . Fatigue 07/04/2014  . Dysthymia 07/04/2014  . Elevated liver enzymes 10/25/2013  . Abnormal mammogram 10/25/2013  . Colon cancer screening 10/25/2013  . Gallbladder polyp 10/25/2013  . Thirst 10/21/2013  . Pain in joint, ankle and foot 10/21/2013  . Routine general medical examination at a health care facility 10/21/2013  . Other screening mammogram 10/21/2013  . OTHER SPECIFIED DISORDER OF GALLBLADDER 05/30/2008  . IRRITABLE BOWEL SYNDROME 04/19/2008  . GERD 11/08/2007  . PEPTIC ULCER DISEASE 11/08/2007  . FIBROCYSTIC BREAST DISEASE 11/08/2007  . INSOMNIA 11/08/2007  . Migraines 11/08/2007   Past Medical History:  Diagnosis Date  . History of arthritis   . History of asthma   . History of chicken pox   . History of depression   . History of hay fever   . History of migraine   . History of stomach ulcers   . History of UTI   . History of venereal warts    on hands   Past Surgical History:  Procedure Laterality Date  . PARTIAL HYSTERECTOMY  01/1998   Social History  Substance Use Topics  . Smoking  status: Never Smoker  . Smokeless tobacco: Never Used  . Alcohol use No   Family History  Problem Relation Age of Onset  . Arthritis Mother   . Other Mother     Blood clots  . Mental illness Mother   . Diabetes Father   . Lung cancer Sister   . Mental illness Sister    No Known Allergies Current Outpatient Prescriptions on File Prior to Visit  Medication Sig Dispense Refill  . Ascorbic Acid (VITAMIN C) 1000 MG tablet Take 1,000 mg by mouth daily.    Marland Kitchen b complex vitamins tablet Take 1 tablet by mouth daily.    . calcium carbonate 200 MG capsule Take 250 mg by mouth daily.    . Calcium-Magnesium-Vitamin D (CALCIUM 1200+D3 PO) Take 1 capsule by mouth daily.    Marland Kitchen gabapentin (NEURONTIN) 600 MG tablet Take 1  tablet (600 mg total) by mouth 3 (three) times daily. 90 tablet 5  . HYDROcodone-homatropine (HYCODAN) 5-1.5 MG/5ML syrup Take 5 mLs by mouth every 8 (eight) hours as needed for cough. 120 mL 0  . MAGNESIUM CITRATE PO Take 1 tablet by mouth daily.    Marland Kitchen POTASSIUM PO Take 1 tablet by mouth daily.    . vitamin E 400 UNIT capsule Take 400 Units by mouth daily.     No current facility-administered medications on file prior to visit.     Review of Systems Review of Systems  Constitutional: Negative for fever, appetite change, and unexpected weight change. pos for fatigue Eyes: Negative for pain and visual disturbance.  ENT pos for feeling of swollen glands in neck w/o ST Respiratory: Negative for cough and wheeze  Cardiovascular: Negative for cp or palpitations   pos for chest pressure  Gastrointestinal: Negative for nausea, diarrhea and constipation.  Genitourinary: Negative for urgency and frequency.  Skin: Negative for pallor or rash   Neurological: Negative for weakness, light-headedness, numbness and headaches.  Hematological: Negative for adenopathy. Does not bruise/bleed easily.  Psychiatric/Behavioral: pos for symptoms of grief/dep and anx w/o SI       Objective:   Physical Exam  Constitutional: She appears well-developed and well-nourished. No distress.  overwt and anxious appearing    HENT:  Head: Normocephalic and atraumatic.  Nose: Nose normal.  Mouth/Throat: Oropharynx is clear and moist.  Eyes: Conjunctivae and EOM are normal. Pupils are equal, round, and reactive to light. Right eye exhibits no discharge. Left eye exhibits no discharge. No scleral icterus.  Neck: Normal range of motion. Neck supple. No JVD present. Carotid bruit is not present. No thyromegaly present.  Cardiovascular: Regular rhythm, normal heart sounds and intact distal pulses.  Exam reveals no gallop.   No murmur heard. Rate in 90s  Pulmonary/Chest: Effort normal and breath sounds normal. No  respiratory distress. She has no wheezes. She has no rales. She exhibits tenderness.  No crackles  Mild anterior cw tenderness w/o skin change or crepitus   Abdominal: Soft. Bowel sounds are normal. She exhibits no distension, no pulsatile liver, no abdominal bruit, no pulsatile midline mass and no mass. There is no hepatosplenomegaly. There is tenderness in the right upper quadrant and epigastric area. There is no rebound, no guarding, no CVA tenderness, no tenderness at McBurney's point and negative Murphy's sign.  Musculoskeletal: She exhibits tenderness. She exhibits no edema.  General myofacial tenderness noted, no joint swelling   No swelling or palpable cords in legs  Neg homans sign  Lymphadenopathy:    She has no  cervical adenopathy.  Neurological: She is alert. She has normal reflexes. She displays no atrophy and no tremor. No cranial nerve deficit or sensory deficit. She exhibits normal muscle tone. Coordination and gait normal.  Skin: Skin is warm and dry. No rash noted. No erythema. No pallor.  Psychiatric: Her speech is normal and behavior is normal. Her mood appears anxious. Her affect is not blunt, not labile and not inappropriate. Thought content is not paranoid. She expresses no homicidal and no suicidal ideation.  Somewhat timid which is her baseline   Husband present answers some of her questions          Assessment & Plan:   Problem List Items Addressed This Visit      Musculoskeletal and Integument   Myofascial pain syndrome    Worse lately with weather change and also stressors/grief Unfortunately gabapentin does not help  Still sense anxiety  Declines lyrica ? If cymbalta would help She is agreeable to try it for this and depression once her other problems are ironed out -will call when she is ready for px Discussed expectations of SNRI medication including time to effectiveness and mechanism of action, also poss of side effects (early and late)- including  mental fuzziness, weight or appetite change, nausea and poss of worse dep or anxiety (even suicidal thoughts)  Pt voiced understanding and will stop med and update if this occurs           Other   Chest pressure    This is reproduced on palpation of chest  Not exertional but occ assoc with sob or rapid HR EKG today -reviewed - sinus tach/ poor R wave prog  Pt has myofacial pain disorder as well  Will ref to cardiology for further eval       Relevant Orders   Ambulatory referral to Cardiology   Dyspepsia    In pt with hx of PUD in the past  No longer on acid reducer  Disc trial of ranitidine 150 bid and update  ? 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

## 2016-06-24 NOTE — Assessment & Plan Note (Addendum)
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

## 2016-06-24 NOTE — Assessment & Plan Note (Addendum)
In pt with hx of PUD in the past  No longer on acid reducer  Disc trial of ranitidine 150 bid and update  ? If this is adding to chest symptoms as well  Did enc her to avoid nsaids

## 2016-06-24 NOTE — Assessment & Plan Note (Addendum)
Episodic and unclear how long going on  Stable vitals and not seemingly orthostatic EKG today -sinus tach rate of 99 with poor R wave progression Disc meals/fluids  Was prev on gabapentin  More stressors as well  Now with some chest pressure  Ref to cardiology for further eval

## 2016-07-10 ENCOUNTER — Encounter: Payer: Self-pay | Admitting: Cardiology

## 2016-07-10 ENCOUNTER — Encounter (INDEPENDENT_AMBULATORY_CARE_PROVIDER_SITE_OTHER): Payer: Self-pay

## 2016-07-10 ENCOUNTER — Ambulatory Visit (INDEPENDENT_AMBULATORY_CARE_PROVIDER_SITE_OTHER): Payer: BLUE CROSS/BLUE SHIELD | Admitting: Cardiology

## 2016-07-10 VITALS — BP 128/78 | HR 81 | Ht 63.5 in | Wt 170.5 lb

## 2016-07-10 DIAGNOSIS — R079 Chest pain, unspecified: Secondary | ICD-10-CM | POA: Diagnosis not present

## 2016-07-10 DIAGNOSIS — R0602 Shortness of breath: Secondary | ICD-10-CM

## 2016-07-10 NOTE — Patient Instructions (Addendum)
Testing/Procedures: Your physician has requested that you have an echocardiogram. Echocardiography is a painless test that uses sound waves to create images of your heart. It provides your doctor with information about the size and shape of your heart and how well your heart's chambers and valves are working. This procedure takes approximately one hour. There are no restrictions for this procedure.  [facility] MYOVIEW  Your caregiver has ordered a Stress Test with nuclear imaging. The purpose of this test is to evaluate the blood supply to your heart muscle. This procedure is referred to as a "Non-Invasive Stress Test." This is because other than having an IV started in your vein, nothing is inserted or "invades" your body. Cardiac stress tests are done to find areas of poor blood flow to the heart by determining the extent of coronary artery disease (CAD).   Please note: these test may take anywhere between 2-4 hours to complete  PLEASE REPORT TO [facility] MEDICAL MALL ENTRANCE  THE VOLUNTEERS AT THE FIRST DESK WILL DIRECT YOU WHERE TO GO  Date of Procedure:_Tuesday July 22, 2016 08:00AM__  Arrival Time for Procedure:_Arrive at 07:45AM to register__    PLEASE NOTIFY THE OFFICE AT LEAST 24 HOURS IN ADVANCE IF YOU ARE UNABLE TO KEEP YOUR APPOINTMENT.  [phone] AND  PLEASE NOTIFY NUCLEAR MEDICINE AT [facility] AT LEAST 24 HOURS IN ADVANCE IF YOU ARE UNABLE TO KEEP YOUR APPOINTMENT. [phone]  How to prepare for your Myoview test:  1. Do not eat or drink after midnight 2. No caffeine for 24 hours prior to test 3. No smoking 24 hours prior to test. 4. Your medication may be taken with water.  If your doctor stopped a medication because of this test, do not take that medication. 5. Ladies, please do not wear dresses.  Skirts or pants are appropriate. Please wear a short sleeve shirt. 6. No perfume, cologne or lotion. 7. Wear comfortable walking shoes. No heels!      Follow-Up: Your physician  recommends that you schedule a follow-up appointment as needed with Dr. Alvino Chapel. We will call you with results and if needed schedule follow up at that time.   It was a pleasure seeing you today here in the office. Please do not hesitate to give [place] a call back if you have any further questions. [phone]  Greenfield Cellar RN, BSN     Echocardiogram An echocardiogram, or echocardiography, uses sound waves (ultrasound) to produce an image of your heart. The echocardiogram is simple, painless, obtained within a short period of time, and offers valuable information to your health care provider. The images from an echocardiogram can provide information such as:  Evidence of coronary artery disease (CAD).  Heart size.  Heart muscle function.  Heart valve function.  Aneurysm detection.  Evidence of a past heart attack.  Fluid buildup around the heart.  Heart muscle thickening.  Assess heart valve function. LET [facility] CARE PROVIDER KNOW ABOUT:  Any allergies you have.  All medicines you are taking, including vitamins, herbs, eye drops, creams, and over-the-counter medicines.  Previous problems you or members of your family have had with the use of anesthetics.  Any blood disorders you have.  Previous surgeries you have had.  Medical conditions you have.  Possibility of pregnancy, if this applies. BEFORE THE PROCEDURE  No special preparation is needed. Eat and drink normally.  PROCEDURE   In order to produce an image of your heart, gel will be applied to your chest and a wand-like  tool (transducer) will be moved over your chest. The gel will help transmit the sound waves from the transducer. The sound waves will harmlessly bounce off your heart to allow the heart images to be captured in real-time motion. These images will then be recorded.  You may need an IV to receive a medicine that improves the quality of the pictures. AFTER THE PROCEDURE You may return to your normal  schedule including diet, activities, and medicines, unless your health care provider tells you otherwise.   This information is not intended to replace advice given to you by your health care provider. Make sure you discuss any questions you have with your health care provider.   Document Released: 08/29/2000 Document Revised: 09/22/2014 Document Reviewed: 05/09/2013 Elsevier Interactive Patient Education 2016 Else[employer] Inc[employer]  Cardiac Nuclear Scanning A cardiac nuclear scan is used to check your heart for problems, such as the following:  A portion of the heart is not getting enough blood.  Part of the heart muscle has died, which happens with a heart attack.  The heart wall is not working normally.  In this test, a radioactive dye (tracer) is injected into your bloodstream. After the tracer has traveled to your heart, a scanning device is used to measure how much of the tracer is absorbed by or distributed to various areas of your heart. [facility] KNOW ABOUT:  Any allergies you have.  All medicines you are taking, including vitamins, herbs, eye drops, creams, and over-the-counter medicines.  Previous problems you or members of your family have had with the use of anesthetics.  Any blood disorders you have.  Previous surgeries you have had.  Medical conditions you have.  RISKS AND COMPLICATIONS Generally, this is a safe procedure. However, as with any procedure, problems can occur. Possible problems include:   Serious chest pain.  Rapid heartbeat.  Sensation of warmth in your chest. This usually passes quickly. BEFORE THE PROCEDURE Ask your health care provider about changing or stopping your regular medicines. PROCEDURE This procedure is usually done at a hospital and takes 2-4 hours.  An IV tube is inserted into one of your veins.  Your health care provider will inject a small amount of radioactive tracer through the tube.  You will then wait for  20-40 minutes while the tracer travels through your bloodstream.  You will lie down on an exam table so images of your heart can be taken. Images will be taken for about 15-20 minutes.  You will exercise on a treadmill or stationary bike. While you exercise, your heart activity will be monitored with an electrocardiogram (ECG), and your blood pressure will be checked.  If you are unable to exercise, you may be given a medicine to make your heart beat faster.  When blood flow to your heart has peaked, tracer will again be injected through the IV tube.  After 20-40 minutes, you will get back on the exam table and have more images taken of your heart.  When the procedure is over, your IV tube will be removed. AFTER THE PROCEDURE  You will likely be able to leave shortly after the test. Unless your health care provider tells you otherwise, you may return to your normal schedule, including diet, activities, and medicines.  Make sure you find out how and when you will get your test results.   This information is not intended to replace advice given to you by your health care provider. Make sure you discuss any  questions you have with your health care provider.   Document Released: 09/26/2004 Document Revised: 09/06/2013 Document Reviewed: 08/10/2013 Elsevier Interactive Patient Education [employer]

## 2016-07-10 NOTE — Progress Notes (Signed)
Cardiology Office Note   Date:  07/10/2016   ID:  KAITHLYN TEAGLE, DOB 22-Nov-1955, MRN [id number]  Referring Doctor:  Roxy Manns, MD   Cardiologist:   Almond Lint, MD   Reason for consultation:  Chief Complaint  Patient presents with  . other    C/o rapid heart beat, left face and arm pain, leg weakness, lightheadedness and sob. Meds reviewed verbally with pt.   Chest pain   History of Present Illness: Sarah Case is a 60 y.o. female who presents for evaluation of chest pain, shortness of breath, dizziness  Patient has a multitude of symptoms. Mainly the chest pain and shortness breath have been going on for several weeks now. Chest pain probably 3 weeks at least. She describes this as tightness in the center of the chest, usually with exertion, sometimes at rest, moderate to severe in intensity, associated with nausea, once she vomited, all associated with shortness of breath and lightheadedness. She has not gone to the ER for evaluation for this.  In terms of shortness of breath, she has noticed progression of dyspnea now associated with minimal housework. She gets tired really easily now.  Patient denies PND, orthopnea, edema. She also complains of body pains arm pain, leg pain.   ROS:  Please see the history of present illness. Aside from mentioned under HPI, all other systems are reviewed and negative.     Past Medical History:  Diagnosis Date  . History of arthritis   . History of asthma   . History of chicken pox   . History of depression   . History of hay fever   . History of migraine   . History of stomach ulcers   . History of UTI   . History of venereal warts    on hands    Past Surgical History:  Procedure Laterality Date  . PARTIAL HYSTERECTOMY  01/1998  . TONSILLECTOMY AND ADENOIDECTOMY       reports that she has never smoked. She has never used smokeless tobacco. She reports that she does not drink alcohol or use drugs.   family  history includes Arthritis in her mother; Diabetes in her father; Lung cancer in her sister; Mental illness in her mother and sister; Other in her mother.   Outpatient Medications Prior to Visit  Medication Sig Dispense Refill  . Ascorbic Acid (VITAMIN C) 1000 MG tablet Take 1,000 mg by mouth daily.    Marland Kitchen b complex vitamins tablet Take 1 tablet by mouth daily.    . calcium carbonate 200 MG capsule Take 250 mg by mouth daily.    . Calcium-Magnesium-Vitamin D (CALCIUM 1200+D3 PO) Take 1 capsule by mouth daily.    Marland Kitchen MAGNESIUM CITRATE PO Take 1 tablet by mouth daily.    Marland Kitchen POTASSIUM PO Take 1 tablet by mouth daily.    . ranitidine (ZANTAC) 150 MG capsule Take 1 capsule (150 mg total) by mouth 2 (two) times daily. 60 capsule 11  . vitamin E 400 UNIT capsule Take 400 Units by mouth daily.     No facility-administered medications prior to visit.      Allergies: Review of patient's allergies indicates no known allergies.    PHYSICAL EXAM: VS:  BP 128/78 (BP Location: Right Arm, Patient Position: Sitting, Cuff Size: Large)   Pulse 81   Ht 5' 3.5" (1.613 m)   Wt 170 lb 8 oz (77.3 kg)   BMI 29.73 kg/m  , Body mass index is  29.73 kg/m. Wt Readings from Last 3 Encounters:  07/10/16 170 lb 8 oz (77.3 kg)  06/24/16 168 lb 8 oz (76.4 kg)  06/10/16 170 lb 4 oz (77.2 kg)    GENERAL:  well developed, well nourished, not in acute distress HEENT: normocephalic, pink conjunctivae, anicteric sclerae, no xanthelasma, normal dentition, oropharynx clear NECK:  no neck vein engorgement, JVP normal, no hepatojugular reflux, carotid upstroke brisk and symmetric, no bruit, no thyromegaly, no lymphadenopathy LUNGS:  good respiratory effort, clear to auscultation bilaterally CV:  PMI not displaced, no thrills, no lifts, S1 and S2 within normal limits, no palpable S3 or S4, no murmurs, no rubs, no gallops ABD:  Soft, nontender, nondistended, normoactive bowel sounds, no abdominal aortic bruit, no hepatomegaly, no  splenomegaly MS: nontender back, no kyphosis, no scoliosis, no joint deformities EXT:  2+ DP/PT pulses, no edema, no varicosities, no cyanosis, no clubbing SKIN: warm, nondiaphoretic, normal turgor, no ulcers NEUROPSYCH: alert, oriented to person, place, and time, sensory/motor grossly intact, normal mood, appropriate affect  Recent Labs: 10/31/2015: ALT 35; BUN 14; Creatinine, Ser 0.60; Hemoglobin 14.1; Platelets 215.0; Potassium 3.2; Sodium 141   Lipid Panel    Component Value Date/Time   CHOL 186 10/21/2013 0845   TRIG 136.0 10/21/2013 0845   HDL 45.00 10/21/2013 0845   CHOLHDL 4 10/21/2013 0845   VLDL 27.2 10/21/2013 0845   LDLCALC 114 (H) 10/21/2013 0845   LDLDIRECT 119.3 06/12/2010 1303     Other studies Reviewed:  EKG:  The ekg from 07/10/2016 was personally reviewed by me and it revealed sinus rhythm, 81 BPM, left axis deviation, low voltages.  Additional studies/ records that were reviewed personally reviewed by me today include: None available   ASSESSMENT AND PLAN: Chest pain Shortness of breath Abnormal EKG Recommend further evaluation with echocardiogram and exercise nuclear stress test.   Current medicines are reviewed at length with the patient today.  The patient does not have concerns regarding medicines.  Labs/ tests ordered today include: Orders Placed This Encounter  Procedures  . EKG 12-Lead    I had a lengthy and detailed discussion with the patient regarding diagnoses, prognosis, diagnostic options, treatment options , and side effects of medications.   I counseled the patient on importance of lifestyle modification including heart healthy diet, regular physical activityOnce cardiac workup is completed.   Disposition:   FU with undersigned after tests    Signed, Almond LintAileen Marvine Encalade, MD  07/10/2016 3:02 PM    [facility]  This note was generated in part with voice recognition software and I apologize for any typographical  errors that were not detected and corrected.

## 2016-07-22 ENCOUNTER — Encounter
Admission: RE | Admit: 2016-07-22 | Discharge: 2016-07-22 | Disposition: A | Discharge status: Home / Self Care | Payer: BLUE CROSS/BLUE SHIELD | Source: Ambulatory Visit | Attending: Cardiology | Admitting: Cardiology

## 2016-07-22 DIAGNOSIS — R0602 Shortness of breath: Secondary | ICD-10-CM | POA: Insufficient documentation

## 2016-07-22 DIAGNOSIS — R079 Chest pain, unspecified: Secondary | ICD-10-CM | POA: Insufficient documentation

## 2016-07-22 LAB — GLUCOSE, CAPILLARY: Glucose-Capillary: 121 mg/dL — ABNORMAL HIGH (ref 65–99)

## 2016-07-22 MED ORDER — TECHNETIUM TC 99M TETROFOSMIN IV KIT
13.0000 | PACK | Freq: Once | INTRAVENOUS | Status: AC | PRN
  Administered 2016-07-22: 11.83 via INTRAVENOUS

## 2016-07-22 MED ORDER — TECHNETIUM TC 99M TETROFOSMIN IV KIT
32.1100 | PACK | Freq: Once | INTRAVENOUS | Status: AC | PRN
  Administered 2016-07-22: 32.11 via INTRAVENOUS

## 2016-07-22 MED ORDER — REGADENOSON 0.4 MG/5ML IV SOLN
0.4000 mg | Freq: Once | INTRAVENOUS | Status: AC
  Administered 2016-07-22: 0.4 mg via INTRAVENOUS

## 2016-07-23 LAB — NM MYOCAR MULTI W/SPECT W/WALL MOTION / EF
CHL CUP NUCLEAR SDS: 3
CSEPHR: 87 %
CSEPPHR: 139 {beats}/min
LVDIAVOL: 38 mL (ref 46–106)
LVSYSVOL: 11 mL
Rest HR: 88 {beats}/min
SRS: 1
SSS: 4
TID: 0.88

## 2016-08-12 ENCOUNTER — Other Ambulatory Visit: Payer: Self-pay

## 2016-08-12 ENCOUNTER — Ambulatory Visit (INDEPENDENT_AMBULATORY_CARE_PROVIDER_SITE_OTHER): Payer: BLUE CROSS/BLUE SHIELD

## 2016-08-12 DIAGNOSIS — R079 Chest pain, unspecified: Secondary | ICD-10-CM | POA: Diagnosis not present

## 2016-08-12 DIAGNOSIS — R0602 Shortness of breath: Secondary | ICD-10-CM

## 2016-08-13 ENCOUNTER — Telehealth: Payer: Self-pay | Admitting: Cardiology

## 2016-08-13 ENCOUNTER — Telehealth: Payer: Self-pay

## 2016-08-13 LAB — ECHOCARDIOGRAM COMPLETE
AOASC: 33 cm
E decel time: 195 msec
EERAT: 10.3
FS: 40 % (ref 28–44)
IV/PV OW: 1.05
LA diam end sys: 36 mm
LA diam index: 1.98 cm/m2
LA vol: 37.8 mL
LASIZE: 36 mm
LAVOLA4C: 44.9 mL
LAVOLIN: 20.8 mL/m2
LV E/e' medial: 10.3
LV TDI E'LATERAL: 8.81
LV e' LATERAL: 8.81 cm/s
LVEEAVG: 10.3
LVOT VTI: 18.8 cm
LVOT area: 3.14 cm2
LVOT diameter: 20 mm
LVOTPV: 95.3 cm/s
LVOTSV: 59 mL
MV Dec: 195
MV Peak grad: 3 mmHg
MV pk A vel: 92.6 m/s
MVPKEVEL: 90.7 m/s
PW: 9.36 mm — AB (ref 0.6–1.1)
RV TAPSE: 22.6 mm
TDI e' medial: 7.29

## 2016-08-13 NOTE — Telephone Encounter (Signed)
Pt called back and pt does not know the name of the med but is a new med for fibromyalgia. Pt still having pain in chest and lt arm but testing cardiologist did was normal and pt said cardiologist thinks CP could be muscular. Pt wants to know what Dr Milinda Antisower thinks could be causing CP and request new med for fibromyalgia sent to [facility] [place]. Pt request cb.

## 2016-08-13 NOTE — Telephone Encounter (Signed)
Pt left v/m; pt has had testing at cardiologist; pt thinks Dr Milinda Antisower was going [facility] [place] after testing done and pt wants to know about med being [facility] to [facility]; pt did not leave name of med. Left v/m requesting pt to cb.

## 2016-08-13 NOTE — Telephone Encounter (Signed)
Pt would like echo results. States she is still hurting.

## 2016-08-13 NOTE — Telephone Encounter (Signed)
Patient called in wanting echocardiogram results and states that she is still having pain. She states that when she presses on her chest it hurts worse. Let her know that echocardiogram and stress test were both normal and that if she is still having chest pain she needs to go to emergency room for further evaluation. Let her know that with it hurting worse when she presses on it she could try some ibuprofen to see if that helps but that if not she should really consider going to the ED for evaluation. She verbalized understanding of results and recommendations and had no further questions at this time.

## 2016-08-13 NOTE — Telephone Encounter (Signed)
Dr Milinda Antisower said to let pt know she will review and call pt back probably on 08/14/16. Per DPR left detailed v/m for pt since could not speak with pt. FYI to Dr Milinda Antisower.

## 2016-08-14 MED ORDER — DULOXETINE HCL 30 MG PO CPEP: 30.0000 mg | ORAL_CAPSULE | Freq: Every day | ORAL | 5 refills | Status: DC

## 2016-08-14 NOTE — Telephone Encounter (Signed)
Pt notified of Dr. Royden Purlower's comments and verbalized understanding and f/u appt is a 30 min appt

## 2016-08-14 NOTE — Telephone Encounter (Signed)
Her cardiac tests are normal.  The fibromyalgia can cause chest pain and tenderness. How are her other symptoms?  Is she short of breath all the time or just when she tries to take a deeper breath (I assume it is sore to take a deep breath)  Did she try the acid reducing medicine for the nausea ?  The next thing I wanted to try for her fibromyalgia is cymbalta - what pharmacy is she using?

## 2016-08-14 NOTE — Telephone Encounter (Signed)
I sent in generic cymbalta It is normal to feel mentally fuzzy and a little nauseated for the first week while you get used to it  If you feel depressed- (rare) - stop it and let me know  Take it once daily  It may take 3-4 weeks to notice a difference F/u with me in about 6 wk unless already scheduled  The dose can be increased if helpful   If chest tenderness continues- I will want to get a chest xray

## 2016-08-14 NOTE — Telephone Encounter (Signed)
Pt notified Rx sent and advise pt of all Dr. Royden Purlower's instructions. Pt verbalized understanding and f/u appt scheduled.   Pt said she forgot to ask Dr. Milinda Antisower should she be worried about her blood sugar, pt said when she had it checked last it was high and she has been feeling a little nauseous and light headed lately and she ? If that's related to her elevated blood sugar, it was done in the morning and pt said she hadn't eaten anything

## 2016-08-14 NOTE — Telephone Encounter (Signed)
Pt notified of Dr. Royden Purlower's comments. Pt said that she ist still having the chest pain and tenderness. She said she is still having off and on SOB, and it does still hurt to take a deep breath. Pt said she also has pain and tingling in her arm and feet. Pt did try the zantac for a while but it didn't help any sxs so she stopped taking it. Pt is willing to try the cymbalta but is a little afraid of the cost, I advise pt if Dr. Milinda Antisower send med in she would have to check the cost of med with her pharmacy, [facility] [place]

## 2016-08-14 NOTE — Telephone Encounter (Signed)
It was not that high - in the 120s and her last a1C was good  We can disc at f/u Doubt her symptoms are related Make sure her f/u is 30 min appt please

## 2016-08-25 ENCOUNTER — Telehealth: Payer: Self-pay | Admitting: *Deleted

## 2016-08-25 NOTE — Telephone Encounter (Signed)
Thanks for the update Over time it should hopefully help more and more

## 2016-08-25 NOTE — Telephone Encounter (Signed)
Patient left a voicemail stating that she was started on Duloxetine and it has helped some. Patient stated that she is still having some pressure, pain and palpitations at times, but better today. Patient stated that she was told to call back and let you know how she was doing and you will up the medication.

## 2016-08-25 NOTE — Telephone Encounter (Signed)
Pt notified of Dr. Tower's comments and verbalized understanding  

## 2016-09-24 ENCOUNTER — Telehealth: Payer: Self-pay

## 2016-09-24 MED ORDER — DULOXETINE HCL 60 MG PO CPEP: 60.0000 mg | ORAL_CAPSULE | Freq: Every day | ORAL | 11 refills | Status: AC

## 2016-09-24 NOTE — Telephone Encounter (Signed)
Pt left v/m; pt taking duloxetine 30 mg one daily; pt wants to know if med could be increased; pt still having problems with pressure in chest, CP and heart racing. Pt has 30 min appt on 09/30/16 at 11:15 with Dr Milinda Antisower. Pt request cb.

## 2016-09-24 NOTE — Telephone Encounter (Signed)
Increase to 60 mg daily  I sent it to her pharmacy Thanks

## 2016-09-24 NOTE — Telephone Encounter (Signed)
Pt notified of Dr. Tower's instructions and verbalized understanding  

## 2016-09-30 ENCOUNTER — Ambulatory Visit: Payer: BLUE CROSS/BLUE SHIELD | Admitting: Family Medicine

## 2016-10-14 ENCOUNTER — Ambulatory Visit: Payer: BLUE CROSS/BLUE SHIELD | Admitting: Family Medicine

## 2016-12-09 ENCOUNTER — Telehealth: Payer: Self-pay | Admitting: Family Medicine

## 2016-12-09 NOTE — Telephone Encounter (Signed)
Patient Name: Sarah Case  DOB: 06/22/1956    Initial Comment Caller states she is not feeling well, ear pressure, heart rascing, chest pain, thinking panic attack, not sleeping well, hurts to breathe   Nurse Assessment  Nurse: Renaldo FiddlerAdkins, RN, Raynelle FanningJulie Date/Time Lamount Cohen(Eastern Time): 12/09/2016 11:22:00 AM  Confirm and document reason for call. If symptomatic, describe symptoms. ---Caller states she is not feeling well today. She has ear pressure, her heart is racing, she has chest pain and it hurts to breathe. Adds she has had the chest pain for several mths, has not been seen and thinks it is due to her anxiety.  Does the patient have any new or worsening symptoms? ---Yes  Will a triage be completed? ---Yes  Related visit to physician within the last 2 weeks? ---No  Does the PT have any chronic conditions? (i.e. diabetes, asthma, etc.) ---Yes  List chronic conditions. ---GERD, Depression, Hypothyroid.  Is this a behavioral health or substance abuse call? ---No     Guidelines    Guideline Title Affirmed Question Affirmed Notes  Chest Pain [1] Chest pain lasts > 5 minutes AND [2] described as crushing, pressure-like, or heavy    Final Disposition User   Call EMS 911 Now Renaldo FiddlerAdkins, RN, Raynelle FanningJulie    Comments  Called pt back, she is not going to ED, does not feel this is life threatening and will cb for an appointment.   Disagree/Comply: Disagree  Disagree/Comply Reason: Disagree with instructions

## 2016-12-09 NOTE — Telephone Encounter (Signed)
Pt said thinks her problem could be allergies; CP on and off, both ears ache.pt out of town until 12/10/16 at [facility]. Pt scheduled appt on 12/10/16 @ 3:15. If pt condition worsens prior to appt pt will go to ED.

## 2016-12-10 ENCOUNTER — Encounter: Payer: Self-pay | Admitting: Family Medicine

## 2016-12-10 ENCOUNTER — Ambulatory Visit (INDEPENDENT_AMBULATORY_CARE_PROVIDER_SITE_OTHER): Payer: BLUE CROSS/BLUE SHIELD | Admitting: Family Medicine

## 2016-12-10 VITALS — Temp 98.6°F | Wt 169.0 lb

## 2016-12-10 DIAGNOSIS — M791 Myalgia: Secondary | ICD-10-CM | POA: Diagnosis not present

## 2016-12-10 DIAGNOSIS — M7918 Myalgia, other site: Secondary | ICD-10-CM

## 2016-12-10 DIAGNOSIS — R42 Dizziness and giddiness: Secondary | ICD-10-CM | POA: Diagnosis not present

## 2016-12-10 DIAGNOSIS — J302 Other seasonal allergic rhinitis: Secondary | ICD-10-CM

## 2016-12-10 DIAGNOSIS — R0789 Other chest pain: Secondary | ICD-10-CM | POA: Diagnosis not present

## 2016-12-10 MED ORDER — CYCLOBENZAPRINE HCL 10 MG PO TABS
5.0000 mg | ORAL_TABLET | Freq: Every evening | ORAL | 0 refills | Status: AC | PRN
Start: 2016-12-10 — End: ?

## 2016-12-10 NOTE — Patient Instructions (Signed)
Please stay on your current duloxetine dose- I don't think you will get much benefit from increasing your dose and you may have side effects.   I have sent a muscle relaxer to your pharmacy- take 1/2 - 1 tablet a couple of nights a week to help with sleep and pain  Try to exercise (walking is fine) at least every other day  Follow up with Dr. Milinda Antisower in about 4 weeks.   For allergies, take over the counter loratadine, cetirizine or fexofenadine- one daily   Myofascial Pain Syndrome and Fibromyalgia Myofascial pain syndrome and fibromyalgia are both pain disorders. This pain may be felt mainly in your muscles.  Myofascial pain syndrome:  Always has trigger points or tender points in the muscle that will cause pain when pressed. The pain may come and go.  Usually affects your neck, upper back, and shoulder areas. The pain often radiates into your arms and hands.  Fibromyalgia:  Has muscle pains and tenderness that come and go.  Is often associated with fatigue and sleep disturbances.  Has trigger points.  Tends to be long-lasting (chronic), but is not life-threatening. Fibromyalgia and myofascial pain are not the same. However, they often occur together. If you have both conditions, each can make the other worse. Both are common and can cause enough pain and fatigue to make day-to-day activities difficult. What are the causes? The exact causes of fibromyalgia and myofascial pain are not known. People with certain gene types may be more likely to develop fibromyalgia. Some factors can be triggers for both conditions, such as:  Spine disorders.  Arthritis.  Severe injury (trauma) and other physical stressors.  Being under a lot of stress.  A medical illness. What are the signs or symptoms? Fibromyalgia  The main symptom of fibromyalgia is widespread pain and tenderness in your muscles. This can vary over time. Pain is sometimes described as stabbing, shooting, or burning. You  may have tingling or numbness, too. You may also have sleep problems and fatigue. You may wake up feeling tired and groggy (fibro fog). Other symptoms may include:  Bowel and bladder problems.  Headaches.  Visual problems.  Problems with odors and noises.  Depression or mood changes.  Painful menstrual periods (dysmenorrhea).  Dry skin or eyes. Myofascial pain syndrome  Symptoms of myofascial pain syndrome include:  Tight, ropy bands of muscle.  Uncomfortable sensations in muscular areas, such as:  Aching.  Cramping.  Burning.  Numbness.  Tingling.  Muscle weakness.  Trouble moving certain muscles freely (range of motion). How is this diagnosed? There are no specific tests to diagnose fibromyalgia or myofascial pain syndrome. Both can be hard to diagnose because their symptoms are common in many other conditions. Your health care provider may suspect one or both of these conditions based on your symptoms and medical history. Your health care provider will also do a physical exam. The key to diagnosing fibromyalgia is having pain, fatigue, and other symptoms for more than three months that cannot be explained by another condition. The key to diagnosing myofascial pain syndrome is finding trigger points in muscles that are tender and cause pain elsewhere in your body (referred pain). How is this treated? Treating fibromyalgia and myofascial pain often requires a team of health care providers. This usually starts with your primary provider and a physical therapist. You may also find it helpful to work with alternative health care providers, such as massage therapists or acupuncturists. Treatment for fibromyalgia may include medicines. This  may include nonsteroidal anti-inflammatory drugs (NSAIDs), along with other medicines. Treatment for myofascial pain may also include:  NSAIDs.  Cooling and stretching of muscles.  Trigger point injections.  Sound wave (ultrasound)  treatments to stimulate muscles. Follow these instructions at home:  Take medicines only as directed by your health care provider.  Exercise as directed by your health care provider or physical therapist.  Try to avoid stressful situations.  Practice relaxation techniques to control your stress. You may want to try:  Biofeedback.  Visual imagery.  Hypnosis.  Muscle relaxation.  Yoga.  Meditation.  Talk to your health care provider about alternative treatments, such as acupuncture or massage treatment.  Maintain a healthy lifestyle. This includes eating a healthy diet and getting enough sleep.  Consider joining a support group.  Do not do activities that stress or strain your muscles. That includes repetitive motions and heavy lifting. Where to find more information:  National Fibromyalgia Association: www.fmaware.org  Arthritis Foundation: www.arthritis.org  American Chronic Pain Association: GumSearch.nl Contact a health care provider if:  You have new symptoms.  Your symptoms get worse.  You have side effects from your medicines.  You have trouble sleeping.  Your condition is causing depression or anxiety. This information is not intended to replace advice given to you by your health care provider. Make sure you discuss any questions you have with your health care provider. Document Released: 09/01/2005 Document Revised: 02/07/2016 Document Reviewed: 06/07/2014 Elsevier Interactive Patient Education  2017 [employer].

## 2016-12-10 NOTE — Progress Notes (Signed)
Pre visit review using our clinic review tool, if applicable. No additional management support is needed unless otherwise documented below in the visit note. 

## 2016-12-10 NOTE — Progress Notes (Signed)
Subjective:    Patient ID: Sarah Case, female    DOB: Jul 03, 1956, 61 y.o.   MRN: [id number]  HPI This is a 61 yo female, accompanied by her husband, who presents today with hoarseness, itchy throat, bilateral ear pain, ears feel "infected," headache, little cough. Husband looked in her ears and saw something abnormal. Feels nauseous. Feels like she is going to pass out, this passes with rest. Husband thinks it is stress. They endorse increased stress. Has difficulty going and staying asleep. Goes to bed between 9-10, awakes at 3:45. Does not feel rested, feels fatigued all the time. Any activity makes her feel tired and like she has to rest. She has to stop after a little bit of work, basic house hold tasks take her a long time to complete. Feels palpitations. Pain across upper chest. Has diagnosis of fibromyalgia. Was started on duloxetine with some improvement, is requesting the dose be increased. Has not followed up as requested with Dr. Milinda Antis.  Chest pain, pre-syncope and SOB have been long standing for at least 6 months. She has seen Dr. Alvino Chapel (cardiology) and has had normal ECHO and pharmacological stress test.    Past Medical History:  Diagnosis Date  . History of arthritis   . History of asthma   . History of chicken pox   . History of depression   . History of hay fever   . History of migraine   . History of stomach ulcers   . History of UTI   . History of venereal warts    on hands   Past Surgical History:  Procedure Laterality Date  . PARTIAL HYSTERECTOMY  01/1998  . TONSILLECTOMY AND ADENOIDECTOMY     Family History  Problem Relation Age of Onset  . Arthritis Mother   . Other Mother     Blood clots  . Mental illness Mother   . Diabetes Father   . Lung cancer Sister   . Mental illness Sister    Social History  Substance Use Topics  . Smoking status: Never Smoker  . Smokeless tobacco: Never Used  . Alcohol use No      Review of Systems  Constitutional:  Positive for chills and fatigue. Negative for fever and unexpected weight change.  HENT: Positive for ear pain, postnasal drip, rhinorrhea, sinus pressure and sore throat. Negative for ear discharge.   Respiratory: Positive for cough and shortness of breath. Negative for wheezing.   Cardiovascular: Positive for chest pain and palpitations.  Musculoskeletal: Positive for myalgias.  Neurological: Positive for headaches.  Psychiatric/Behavioral: Positive for sleep disturbance. The patient is nervous/anxious.        Objective:   Physical Exam  Constitutional: She is oriented to person, place, and time. She appears well-developed and well-nourished. No distress.  HENT:  Head: Normocephalic and atraumatic.  Right Ear: External ear and ear canal normal.  Left Ear: External ear and ear canal normal.  Nose: Mucosal edema and rhinorrhea present.  Mouth/Throat: Oropharynx is clear and moist.  Dull TMs bilaterally.   Eyes: Conjunctivae are normal.  Neck: Normal range of motion. Neck supple.  Cardiovascular: Normal rate, regular rhythm and normal heart sounds.   Pulmonary/Chest: Effort normal and breath sounds normal. She exhibits tenderness (diffuse anterior).  Lymphadenopathy:    She has no cervical adenopathy.  Neurological: She is alert and oriented to person, place, and time.  Skin: Skin is warm and dry. She is not diaphoretic.  Psychiatric: Her behavior is normal. Judgment and  thought content normal.  Affect very flat. Husband answers some questions for patient.   Vitals reviewed.     Temp 98.6 F (37 C) (Oral)   Wt 169 lb (76.7 kg)   SpO2 95%   BMI 29.47 kg/m   BP 122/82 Wt Readings from Last 3 Encounters:  12/10/16 169 lb (76.7 kg)  07/10/16 170 lb 8 oz (77.3 kg)  06/24/16 168 lb 8 oz (76.4 kg)       Assessment & Plan:  1. Acute seasonal allergic rhinitis, unspecified trigger - Provided written and verbal information regarding diagnosis and treatment. - encouraged her  to take an OTC long acting antihistmine  2. Myofascial pain syndrome - Provided written and verbal information regarding diagnosis and treatment. - Discussed symptoms and briefly, strategies to improve sleep, encouraged stress relief measures and regular exercise. Discussed using cyclobenzaprine sparingly for pain/sleep - cyclobenzaprine (FLEXERIL) 10 MG tablet; Take 0.5-1 tablets (5-10 mg total) by mouth at bedtime as needed for muscle spasms.  Dispense: 30 tablet; Refill: 0 - follow up with Dr. Milinda Antisower in 1 month  3. Chest discomfort - persistent with negative cardiac work up, suspect related to #2, encouraged daily gentle ROM, try heat - cyclobenzaprine (FLEXERIL) 10 MG tablet; Take 0.5-1 tablets (5-10 mg total) by mouth at bedtime as needed for muscle spasms.  Dispense: 30 tablet; Refill: 0  4. Light headedness - also persistent, not sure if related to increased stress or deconditioning - encouraged increased fluids, frequent rest periods  Olean Reeeborah Dick Hark, FNP-BC  [facility] [place], [facility]  12/11/2016 9:36 PM

## 2017-01-27 ENCOUNTER — Ambulatory Visit (INDEPENDENT_AMBULATORY_CARE_PROVIDER_SITE_OTHER): Payer: BLUE CROSS/BLUE SHIELD | Admitting: Family Medicine

## 2017-01-27 ENCOUNTER — Encounter: Payer: Self-pay | Admitting: Family Medicine

## 2017-01-27 VITALS — BP 110/82 | HR 96 | Temp 98.0°F | Ht 62.5 in | Wt 168.5 lb

## 2017-01-27 DIAGNOSIS — R0789 Other chest pain: Secondary | ICD-10-CM

## 2017-01-27 DIAGNOSIS — T7840XA Allergy, unspecified, initial encounter: Secondary | ICD-10-CM | POA: Diagnosis not present

## 2017-01-27 NOTE — Patient Instructions (Signed)
Great to see you. Please stop by to see Sarah Case on your way out.   

## 2017-01-27 NOTE — Progress Notes (Signed)
Subjective:   Patient ID: Sarah Case, female    DOB: 1956/04/22, 61 y.o.   MRN: [id number]  ZAFIRAH VANZEE is a pleasant 61 y.o. year old female pt of Dr. Milinda Antis, new to me, who presents to clinic today with Hospitalization Follow-up and Emesis (sweats)  on 01/27/2017  HPI:  Notes reviewed.  Was seen at [facility] on 01/21/17 for chest pain and intermittent sweats/nausea for past 4 -6 months but felt it was acutely worse that day while performing yard work.  Saw Deboraha Sprang, NP for 12/10/16 for similar symptoms.  Note reviewed.  Given flexeril as needed for chest discomfort which she has not been taking.  She was seen by cardiology in 07/2016- NM Myocardial scan with normal wall motion, no EKG changes concerning for ischemia, low risk scan.    At [facility], EKG, CXR neg. CT angio neg for PE. CBC, CMET, BNP, troponin, Lipase all neg.  Cardiac source ruled out and advised to follow up with PCP.  Today she feels about the same.  She does not want to see cardiology.  She thinks her symptoms are related to mold in her house and new cleaning products at work.    Current Outpatient Prescriptions on File Prior to Visit  Medication Sig Dispense Refill  . Ascorbic Acid (VITAMIN C) 1000 MG tablet Take 1,000 mg by mouth daily.    Marland Kitchen b complex vitamins tablet Take 1 tablet by mouth daily.    . calcium carbonate 200 MG capsule Take 250 mg by mouth daily.    . Calcium-Magnesium-Vitamin D (CALCIUM 1200+D3 PO) Take 1 capsule by mouth daily.    . cyclobenzaprine (FLEXERIL) 10 MG tablet Take 0.5-1 tablets (5-10 mg total) by mouth at bedtime as needed for muscle spasms. 30 tablet 0  . DULoxetine (CYMBALTA) 60 MG capsule Take 1 capsule (60 mg total) by mouth daily. 30 capsule 11  . MAGNESIUM CITRATE PO Take 1 tablet by mouth daily.    Marland Kitchen POTASSIUM PO Take 1 tablet by mouth daily.    . ranitidine (ZANTAC) 150 MG capsule Take 1 capsule (150 mg total) by mouth 2 (two) times daily. 60 capsule 11  . vitamin  E 400 UNIT capsule Take 400 Units by mouth daily.     No current facility-administered medications on file prior to visit.     No Known Allergies  Past Medical History:  Diagnosis Date  . History of arthritis   . History of asthma   . History of chicken pox   . History of depression   . History of hay fever   . History of migraine   . History of stomach ulcers   . History of UTI   . History of venereal warts    on hands    Past Surgical History:  Procedure Laterality Date  . PARTIAL HYSTERECTOMY  01/1998  . TONSILLECTOMY AND ADENOIDECTOMY      Family History  Problem Relation Age of Onset  . Arthritis Mother   . Other Mother        Blood clots  . Mental illness Mother   . Diabetes Father   . Lung cancer Sister   . Mental illness Sister     Social History   Social History  . Marital status: Married    Spouse name: N/A  . Number of children: N/A  . Years of education: N/A   Occupational History  . Not on file.   Social History Main Topics  .  Smoking status: Never Smoker  . Smokeless tobacco: Never Used  . Alcohol use No  . Drug use: No  . Sexual activity: Not on file   Other Topics Concern  . Not on file   Social History Narrative  . No narrative on file   The PMH, PSH, Social History, Family History, Medications, and allergies [facility], and have been updated if relevant.   Review of Systems  Cardiovascular: Positive for chest pain. Negative for palpitations and leg swelling.  Gastrointestinal: Positive for nausea. Negative for vomiting.  Musculoskeletal: Negative.   Neurological: Positive for dizziness.  Hematological: Negative.   Psychiatric/Behavioral: Negative.   All other systems reviewed and are negative.      Objective:    BP 110/82   Pulse 96   Temp 98 F (36.7 C)   Ht 5' 2.5" (1.588 m)   Wt 168 lb 8 oz (76.4 kg)   SpO2 96%   BMI 30.33 kg/m    Physical Exam  Constitutional: She is oriented to person,  place, and time. She appears well-developed and well-nourished. No distress.  HENT:  Head: Normocephalic.  Eyes: Conjunctivae are normal.  Pulmonary/Chest: Effort normal.  Abdominal: Soft.  Musculoskeletal: Normal range of motion.  Neurological: She is alert and oriented to person, place, and time. No cranial nerve deficit.  Skin: Skin is warm and dry. She is not diaphoretic.  Psychiatric: She has a normal mood and affect. Her behavior is normal. Judgment and thought content normal.  Nursing note and vitals reviewed.         Assessment & Plan:   Chest pressure  Allergic reaction, initial encounter No Follow-up on file.

## 2017-01-28 NOTE — Assessment & Plan Note (Signed)
Pt feels strongly that her chest pressure could be an allergic reaction. Will refer to allergist for testing. The patient indicates understanding of these issues and agrees with the plan.

## 2017-01-28 NOTE — Assessment & Plan Note (Signed)
Ongoing issue, neg cardiac work up in past and reassuring troponin, EKG in the ER this weekend as well. CT angio neg for PE. Agree unlikely cardiac but did advise she follow up with her cardiologist if symptoms continue.

## 2017-02-02 ENCOUNTER — Ambulatory Visit: Payer: BLUE CROSS/BLUE SHIELD | Admitting: Family Medicine

## 2019-10-12 DIAGNOSIS — Z79899 Other long term (current) drug therapy: Secondary | ICD-10-CM | POA: Diagnosis not present

## 2019-10-12 DIAGNOSIS — G4733 Obstructive sleep apnea (adult) (pediatric): Secondary | ICD-10-CM | POA: Diagnosis not present

## 2019-10-12 DIAGNOSIS — M791 Myalgia, unspecified site: Secondary | ICD-10-CM | POA: Diagnosis not present

## 2019-10-12 DIAGNOSIS — R69 Illness, unspecified: Secondary | ICD-10-CM | POA: Diagnosis not present

## 2019-10-12 DIAGNOSIS — R05 Cough: Secondary | ICD-10-CM | POA: Diagnosis not present

## 2019-10-12 DIAGNOSIS — R918 Other nonspecific abnormal finding of lung field: Secondary | ICD-10-CM | POA: Diagnosis not present

## 2019-10-12 DIAGNOSIS — U071 COVID-19: Secondary | ICD-10-CM | POA: Diagnosis not present

## 2019-10-12 DIAGNOSIS — R079 Chest pain, unspecified: Secondary | ICD-10-CM | POA: Diagnosis not present

## 2019-10-12 DIAGNOSIS — K219 Gastro-esophageal reflux disease without esophagitis: Secondary | ICD-10-CM | POA: Diagnosis not present

## 2019-10-12 DIAGNOSIS — R519 Headache, unspecified: Secondary | ICD-10-CM | POA: Diagnosis not present

## 2019-11-08 DIAGNOSIS — R69 Illness, unspecified: Secondary | ICD-10-CM | POA: Diagnosis not present

## 2019-11-08 DIAGNOSIS — Z6832 Body mass index (BMI) 32.0-32.9, adult: Secondary | ICD-10-CM | POA: Diagnosis not present

## 2019-11-08 DIAGNOSIS — M549 Dorsalgia, unspecified: Secondary | ICD-10-CM | POA: Diagnosis not present

## 2019-11-08 DIAGNOSIS — R0789 Other chest pain: Secondary | ICD-10-CM | POA: Diagnosis not present

## 2019-12-06 DIAGNOSIS — R69 Illness, unspecified: Secondary | ICD-10-CM | POA: Diagnosis not present

## 2019-12-06 DIAGNOSIS — H9202 Otalgia, left ear: Secondary | ICD-10-CM | POA: Diagnosis not present

## 2019-12-06 DIAGNOSIS — R0789 Other chest pain: Secondary | ICD-10-CM | POA: Diagnosis not present

## 2019-12-06 DIAGNOSIS — N959 Unspecified menopausal and perimenopausal disorder: Secondary | ICD-10-CM | POA: Diagnosis not present

## 2019-12-20 DIAGNOSIS — N959 Unspecified menopausal and perimenopausal disorder: Secondary | ICD-10-CM | POA: Diagnosis not present

## 2019-12-20 DIAGNOSIS — Z780 Asymptomatic menopausal state: Secondary | ICD-10-CM | POA: Diagnosis not present

## 2019-12-20 DIAGNOSIS — Z0000 Encounter for general adult medical examination without abnormal findings: Secondary | ICD-10-CM | POA: Diagnosis not present

## 2019-12-20 DIAGNOSIS — M8588 Other specified disorders of bone density and structure, other site: Secondary | ICD-10-CM | POA: Diagnosis not present

## 2019-12-30 DIAGNOSIS — R69 Illness, unspecified: Secondary | ICD-10-CM | POA: Diagnosis not present

## 2019-12-30 DIAGNOSIS — Z6831 Body mass index (BMI) 31.0-31.9, adult: Secondary | ICD-10-CM | POA: Diagnosis not present

## 2019-12-30 DIAGNOSIS — M5431 Sciatica, right side: Secondary | ICD-10-CM | POA: Diagnosis not present

## 2020-01-27 DIAGNOSIS — R69 Illness, unspecified: Secondary | ICD-10-CM | POA: Diagnosis not present

## 2020-01-27 DIAGNOSIS — R0789 Other chest pain: Secondary | ICD-10-CM | POA: Diagnosis not present

## 2020-02-08 DIAGNOSIS — R0789 Other chest pain: Secondary | ICD-10-CM | POA: Diagnosis not present

## 2020-02-08 DIAGNOSIS — R079 Chest pain, unspecified: Secondary | ICD-10-CM | POA: Diagnosis not present

## 2020-03-29 DIAGNOSIS — H2510 Age-related nuclear cataract, unspecified eye: Secondary | ICD-10-CM | POA: Diagnosis not present

## 2020-04-19 DIAGNOSIS — H04123 Dry eye syndrome of bilateral lacrimal glands: Secondary | ICD-10-CM | POA: Diagnosis not present

## 2020-04-19 DIAGNOSIS — H33002 Unspecified retinal detachment with retinal break, left eye: Secondary | ICD-10-CM | POA: Diagnosis not present

## 2020-04-19 DIAGNOSIS — H26222 Cataract secondary to ocular disorders (degenerative) (inflammatory), left eye: Secondary | ICD-10-CM | POA: Diagnosis not present

## 2020-04-19 DIAGNOSIS — H2511 Age-related nuclear cataract, right eye: Secondary | ICD-10-CM | POA: Diagnosis not present

## 2020-04-23 DIAGNOSIS — R030 Elevated blood-pressure reading, without diagnosis of hypertension: Secondary | ICD-10-CM | POA: Diagnosis not present

## 2020-04-23 DIAGNOSIS — M797 Fibromyalgia: Secondary | ICD-10-CM | POA: Diagnosis not present

## 2020-04-23 DIAGNOSIS — G4700 Insomnia, unspecified: Secondary | ICD-10-CM | POA: Diagnosis not present

## 2020-04-23 DIAGNOSIS — R69 Illness, unspecified: Secondary | ICD-10-CM | POA: Diagnosis not present

## 2020-04-23 DIAGNOSIS — E669 Obesity, unspecified: Secondary | ICD-10-CM | POA: Diagnosis not present

## 2020-04-23 DIAGNOSIS — R32 Unspecified urinary incontinence: Secondary | ICD-10-CM | POA: Diagnosis not present

## 2020-04-23 DIAGNOSIS — G8929 Other chronic pain: Secondary | ICD-10-CM | POA: Diagnosis not present

## 2020-04-27 DIAGNOSIS — Z01812 Encounter for preprocedural laboratory examination: Secondary | ICD-10-CM | POA: Diagnosis not present

## 2020-04-27 DIAGNOSIS — Z20822 Contact with and (suspected) exposure to covid-19: Secondary | ICD-10-CM | POA: Diagnosis not present

## 2020-04-30 DIAGNOSIS — Z79899 Other long term (current) drug therapy: Secondary | ICD-10-CM | POA: Diagnosis not present

## 2020-04-30 DIAGNOSIS — Z6830 Body mass index (BMI) 30.0-30.9, adult: Secondary | ICD-10-CM | POA: Diagnosis not present

## 2020-04-30 DIAGNOSIS — H25812 Combined forms of age-related cataract, left eye: Secondary | ICD-10-CM | POA: Diagnosis not present

## 2020-04-30 DIAGNOSIS — H25042 Posterior subcapsular polar age-related cataract, left eye: Secondary | ICD-10-CM | POA: Diagnosis not present

## 2020-04-30 DIAGNOSIS — R69 Illness, unspecified: Secondary | ICD-10-CM | POA: Diagnosis not present

## 2020-04-30 DIAGNOSIS — E669 Obesity, unspecified: Secondary | ICD-10-CM | POA: Diagnosis not present

## 2020-04-30 DIAGNOSIS — K219 Gastro-esophageal reflux disease without esophagitis: Secondary | ICD-10-CM | POA: Diagnosis not present

## 2020-04-30 DIAGNOSIS — H26222 Cataract secondary to ocular disorders (degenerative) (inflammatory), left eye: Secondary | ICD-10-CM | POA: Diagnosis not present

## 2020-05-08 DIAGNOSIS — H2102 Hyphema, left eye: Secondary | ICD-10-CM | POA: Diagnosis not present

## 2020-05-08 DIAGNOSIS — H43391 Other vitreous opacities, right eye: Secondary | ICD-10-CM | POA: Diagnosis not present

## 2020-05-08 DIAGNOSIS — H31412 Hemorrhagic choroidal detachment, left eye: Secondary | ICD-10-CM | POA: Diagnosis not present

## 2020-05-08 DIAGNOSIS — H33012 Retinal detachment with single break, left eye: Secondary | ICD-10-CM | POA: Diagnosis not present

## 2020-05-09 DIAGNOSIS — Z01812 Encounter for preprocedural laboratory examination: Secondary | ICD-10-CM | POA: Diagnosis not present

## 2020-05-09 DIAGNOSIS — Z20822 Contact with and (suspected) exposure to covid-19: Secondary | ICD-10-CM | POA: Diagnosis not present

## 2020-05-11 DIAGNOSIS — H33002 Unspecified retinal detachment with retinal break, left eye: Secondary | ICD-10-CM | POA: Diagnosis not present

## 2020-05-11 DIAGNOSIS — Z961 Presence of intraocular lens: Secondary | ICD-10-CM | POA: Diagnosis not present

## 2020-05-11 DIAGNOSIS — Z6829 Body mass index (BMI) 29.0-29.9, adult: Secondary | ICD-10-CM | POA: Diagnosis not present

## 2020-05-11 DIAGNOSIS — G4733 Obstructive sleep apnea (adult) (pediatric): Secondary | ICD-10-CM | POA: Diagnosis not present

## 2020-05-11 DIAGNOSIS — K529 Noninfective gastroenteritis and colitis, unspecified: Secondary | ICD-10-CM | POA: Diagnosis not present

## 2020-05-11 DIAGNOSIS — H539 Unspecified visual disturbance: Secondary | ICD-10-CM | POA: Diagnosis not present

## 2020-05-11 DIAGNOSIS — K219 Gastro-esophageal reflux disease without esophagitis: Secondary | ICD-10-CM | POA: Diagnosis not present

## 2020-05-11 DIAGNOSIS — H31302 Unspecified choroidal hemorrhage, left eye: Secondary | ICD-10-CM | POA: Diagnosis not present

## 2020-05-11 DIAGNOSIS — E669 Obesity, unspecified: Secondary | ICD-10-CM | POA: Diagnosis not present

## 2020-05-11 DIAGNOSIS — H31402 Unspecified choroidal detachment, left eye: Secondary | ICD-10-CM | POA: Diagnosis not present

## 2020-05-11 DIAGNOSIS — J309 Allergic rhinitis, unspecified: Secondary | ICD-10-CM | POA: Diagnosis not present

## 2020-05-15 DIAGNOSIS — H31302 Unspecified choroidal hemorrhage, left eye: Secondary | ICD-10-CM | POA: Diagnosis not present

## 2020-05-30 DIAGNOSIS — H31302 Unspecified choroidal hemorrhage, left eye: Secondary | ICD-10-CM | POA: Diagnosis not present

## 2020-06-11 DIAGNOSIS — H33002 Unspecified retinal detachment with retinal break, left eye: Secondary | ICD-10-CM | POA: Diagnosis not present

## 2020-06-11 DIAGNOSIS — H31302 Unspecified choroidal hemorrhage, left eye: Secondary | ICD-10-CM | POA: Diagnosis not present

## 2020-06-12 DIAGNOSIS — Z6828 Body mass index (BMI) 28.0-28.9, adult: Secondary | ICD-10-CM | POA: Diagnosis not present

## 2020-06-12 DIAGNOSIS — R1110 Vomiting, unspecified: Secondary | ICD-10-CM | POA: Diagnosis not present

## 2020-06-12 DIAGNOSIS — R42 Dizziness and giddiness: Secondary | ICD-10-CM | POA: Diagnosis not present

## 2020-06-12 DIAGNOSIS — H33002 Unspecified retinal detachment with retinal break, left eye: Secondary | ICD-10-CM | POA: Diagnosis not present

## 2020-06-19 DIAGNOSIS — R519 Headache, unspecified: Secondary | ICD-10-CM | POA: Diagnosis not present

## 2020-06-19 DIAGNOSIS — R112 Nausea with vomiting, unspecified: Secondary | ICD-10-CM | POA: Diagnosis not present

## 2020-06-19 DIAGNOSIS — Z6828 Body mass index (BMI) 28.0-28.9, adult: Secondary | ICD-10-CM | POA: Diagnosis not present

## 2020-06-22 DIAGNOSIS — Z20822 Contact with and (suspected) exposure to covid-19: Secondary | ICD-10-CM | POA: Diagnosis not present

## 2020-06-25 DIAGNOSIS — E669 Obesity, unspecified: Secondary | ICD-10-CM | POA: Diagnosis not present

## 2020-06-25 DIAGNOSIS — K219 Gastro-esophageal reflux disease without esophagitis: Secondary | ICD-10-CM | POA: Diagnosis not present

## 2020-06-25 DIAGNOSIS — R69 Illness, unspecified: Secondary | ICD-10-CM | POA: Diagnosis not present

## 2020-06-25 DIAGNOSIS — H338 Other retinal detachments: Secondary | ICD-10-CM | POA: Diagnosis not present

## 2020-06-25 DIAGNOSIS — Z90710 Acquired absence of both cervix and uterus: Secondary | ICD-10-CM | POA: Diagnosis not present

## 2020-06-25 DIAGNOSIS — K529 Noninfective gastroenteritis and colitis, unspecified: Secondary | ICD-10-CM | POA: Diagnosis not present

## 2020-06-25 DIAGNOSIS — Z6829 Body mass index (BMI) 29.0-29.9, adult: Secondary | ICD-10-CM | POA: Diagnosis not present

## 2020-06-25 DIAGNOSIS — G4733 Obstructive sleep apnea (adult) (pediatric): Secondary | ICD-10-CM | POA: Diagnosis not present

## 2020-06-25 DIAGNOSIS — H3342 Traction detachment of retina, left eye: Secondary | ICD-10-CM | POA: Diagnosis not present

## 2020-06-25 DIAGNOSIS — Z961 Presence of intraocular lens: Secondary | ICD-10-CM | POA: Diagnosis not present

## 2020-06-25 DIAGNOSIS — H33002 Unspecified retinal detachment with retinal break, left eye: Secondary | ICD-10-CM | POA: Diagnosis not present

## 2020-06-26 DIAGNOSIS — H3342 Traction detachment of retina, left eye: Secondary | ICD-10-CM | POA: Diagnosis not present

## 2020-07-03 DIAGNOSIS — Z6829 Body mass index (BMI) 29.0-29.9, adult: Secondary | ICD-10-CM | POA: Diagnosis not present

## 2020-07-03 DIAGNOSIS — H5712 Ocular pain, left eye: Secondary | ICD-10-CM | POA: Diagnosis not present

## 2020-07-03 DIAGNOSIS — H3342 Traction detachment of retina, left eye: Secondary | ICD-10-CM | POA: Diagnosis not present

## 2020-07-26 DIAGNOSIS — H31302 Unspecified choroidal hemorrhage, left eye: Secondary | ICD-10-CM | POA: Diagnosis not present

## 2020-07-26 DIAGNOSIS — H3342 Traction detachment of retina, left eye: Secondary | ICD-10-CM | POA: Diagnosis not present

## 2020-08-08 DIAGNOSIS — H3342 Traction detachment of retina, left eye: Secondary | ICD-10-CM | POA: Diagnosis not present

## 2020-08-08 DIAGNOSIS — H33002 Unspecified retinal detachment with retinal break, left eye: Secondary | ICD-10-CM | POA: Diagnosis not present

## 2020-08-23 DIAGNOSIS — H3322 Serous retinal detachment, left eye: Secondary | ICD-10-CM | POA: Diagnosis not present

## 2022-04-11 ENCOUNTER — Other Ambulatory Visit: Payer: Self-pay | Admitting: Internal Medicine

## 2022-04-11 DIAGNOSIS — Z1231 Encounter for screening mammogram for malignant neoplasm of breast: Secondary | ICD-10-CM

## 2022-08-01 ENCOUNTER — Other Ambulatory Visit: Payer: Self-pay | Admitting: Internal Medicine

## 2022-08-01 DIAGNOSIS — Z13820 Encounter for screening for osteoporosis: Secondary | ICD-10-CM

## 2022-08-04 ENCOUNTER — Other Ambulatory Visit: Payer: Self-pay | Admitting: Internal Medicine

## 2022-08-04 DIAGNOSIS — Z13820 Encounter for screening for osteoporosis: Secondary | ICD-10-CM
